# Patient Record
Sex: Female | Born: 1965 | Race: White | Hispanic: No | Smoking: Never smoker
Health system: Southern US, Community
[De-identification: ages and names within clinical notes are randomized; demographics above are authoritative.]

## PROBLEM LIST (undated history)

## (undated) DIAGNOSIS — K589 Irritable bowel syndrome without diarrhea: Secondary | ICD-10-CM

## (undated) DIAGNOSIS — Z8052 Family history of malignant neoplasm of bladder: Secondary | ICD-10-CM

## (undated) DIAGNOSIS — J45909 Unspecified asthma, uncomplicated: Secondary | ICD-10-CM

## (undated) DIAGNOSIS — Z800 Family history of malignant neoplasm of digestive organs: Secondary | ICD-10-CM

## (undated) DIAGNOSIS — Z803 Family history of malignant neoplasm of breast: Secondary | ICD-10-CM

## (undated) DIAGNOSIS — I10 Essential (primary) hypertension: Secondary | ICD-10-CM

## (undated) HISTORY — DX: Family history of malignant neoplasm of bladder: Z80.52

## (undated) HISTORY — DX: Family history of malignant neoplasm of digestive organs: Z80.0

## (undated) HISTORY — PX: ABDOMINAL HYSTERECTOMY: SHX81

## (undated) HISTORY — DX: Family history of malignant neoplasm of breast: Z80.3

---

## 1999-01-14 ENCOUNTER — Encounter: Payer: Self-pay | Admitting: Orthopedic Surgery

## 1999-01-14 ENCOUNTER — Encounter: Admission: RE | Admit: 1999-01-14 | Discharge: 1999-01-14 | Payer: Self-pay | Admitting: Orthopedic Surgery

## 1999-01-19 ENCOUNTER — Other Ambulatory Visit: Admission: RE | Admit: 1999-01-19 | Discharge: 1999-01-19 | Payer: Self-pay | Admitting: Family Medicine

## 2000-02-04 ENCOUNTER — Other Ambulatory Visit: Admission: RE | Admit: 2000-02-04 | Discharge: 2000-02-04 | Payer: Self-pay | Admitting: Family Medicine

## 2001-02-19 ENCOUNTER — Other Ambulatory Visit: Admission: RE | Admit: 2001-02-19 | Discharge: 2001-02-19 | Payer: Self-pay | Admitting: Family Medicine

## 2002-12-20 ENCOUNTER — Encounter: Admission: RE | Admit: 2002-12-20 | Discharge: 2002-12-20 | Payer: Self-pay | Admitting: Family Medicine

## 2003-03-31 ENCOUNTER — Other Ambulatory Visit: Admission: RE | Admit: 2003-03-31 | Discharge: 2003-03-31 | Payer: Self-pay | Admitting: Family Medicine

## 2004-04-13 ENCOUNTER — Other Ambulatory Visit: Admission: RE | Admit: 2004-04-13 | Discharge: 2004-04-13 | Payer: Self-pay | Admitting: Obstetrics & Gynecology

## 2006-09-19 ENCOUNTER — Ambulatory Visit (HOSPITAL_COMMUNITY): Admission: RE | Admit: 2006-09-19 | Discharge: 2006-09-21 | Payer: Self-pay | Admitting: Obstetrics & Gynecology

## 2006-09-19 ENCOUNTER — Encounter (INDEPENDENT_AMBULATORY_CARE_PROVIDER_SITE_OTHER): Payer: Self-pay | Admitting: Obstetrics & Gynecology

## 2010-05-25 NOTE — Op Note (Signed)
NAME:  Christina Watson, Christina Watson           ACCOUNT NO.:  [id number]   MEDICAL RECORD NO.:  [id number]          PATIENT TYPE:  OIB   LOCATION:  9320                          FACILITY:  [facility]   PHYSICIAN:  Freddy Finner, M.D.   DATE OF BIRTH:  Aug 10, 1965   DATE OF PROCEDURE:  09/19/2006  DATE OF DISCHARGE:                               OPERATIVE REPORT   PREOPERATIVE DIAGNOSIS:  Uterine leiomyomata.   POSTOPERATIVE DIAGNOSES:  Uterine leiomyomata with inter-ligamentous  right adnexal fibroid.   OPERATIVE PROCEDURE:  Laparoscopically-assisted vaginal hysterectomy,  right salpingectomy which was required to remove the inter-ligamentous  fibroid.   SURGEON:  Freddy Finner, M.D.   ASSISTANT:  __________ .   ESTIMATED INTRAOPERATIVE BLOOD LOSS:  Was 250 mL.   INTRAOPERATIVE COMPLICATIONS:  None.   INDICATIONS FOR PROCEDURE:  The details of the present illness are  recorded in the admission note.   DESCRIPTION OF PROCEDURE:  The patient was admitted on the morning of  surgery.  She was given one bolus of Ancef IV.  She was placed in PAS  compression hose.  She brought to the operating room and placed under  adequate general endotracheal anesthesia.  The abdomen, perineum and  vagina were prepped and draped in the usual fashion.  The bladder was  evacuated with a Robinson catheter.  Two small incisions were made, one  at the umbilicus and one just above the symphysis.  An 11 mm blade and  disposable trocar were introduced at the umbilicus while elevating the  anterior abdominal wall manually.  Careful and systematic examination of  the pelvic and abdominal contents was carried out.  The findings were  recorded in photographs which were returned to the office record.  The  upper abdomen appeared to be normal, including the liver and  gallbladder.  The appendix was visualized and was normal.  The pelvic  findings were normal.  The left tube and ovary were irregularly  enlarged.  The uterus  with fibroids, including a large inter-ligamentous  fibroid on the right side which distorted the mesosalpinx and distorted  the fallopian tube.  The right ovary itself was normal.  There was a  myoma along the right lateral uterus near the level of the uterine  artery.  Using a 5 mm trocar through the lower incision and a spring-  loaded grasping forceps and a blunt probe, appropriate traction and  exposure was achieved and the Giles tripolar device was used to  progressively develop pedicles on each side.  This dissection included  the right fallopian tube on the right side, leaving only the right  ovary.  Dissection was carried down to a level just above the uterine  artery, at the level of the fibroid, as described above.  On the left  side a similar dissection was carried out.  The tube and ovary were both  left on the left side.   Attention was turned vaginally.  A posterior weighted vaginal retractor  was placed.  The cervix was grasped with a Gerilyn Pilgrim tenaculum.  The mucosa  posterior to the cervix was grasped with an Lavena Bullion  and incision made  posterior to the cervix into the cul-de-sac.  The cervix was  circumscribed with a scalpel.  Using the LigaSure device the uterosacral  pedicles were sealed and divided, as were bladder pillars on either  side.  The bladder was further advanced off the cervix and lower  segment.  The Cardinal ligament and pedicles were intact, sealed and  divided with the LigaSure device.  The anterior peritoneum was entered.  The vessel pedicles were intact, sealed and divided.  This completed the  dissection on the right.  On the left an additional pedicle was  required, after delivery of the uterus through the vaginal introitus.  The uterus was weighed and weighed 260 grams.  The angles of the vagina  were anchored to the uterosacrals with a mattress sutures of #0  Monocryl.  The uterosacrals were plicated and the posterior peritoneum  closed with  interrupted #0 Monocryl.  The vaginal mucosa was closed  vertically with figure-of-eight's of #0 Monocryl.  The Foley catheter  was placed.  Clear urine was obtained.  A re-inspection laparoscopically  revealed minimal small oozing sources which were easily controlled with  the bipolar forceps, after a careful irrigation and confirmation of  hemostasis.  The irrigation solution was aspirated from the abdomen.  The instruments were removed.  The skin incisions were anesthetized with  __________  with 0.25% Marcaine.  The incisions were closed with  interrupted subcuticular sutures of #3-0 Dexon.  Steri-Strips were  applied to the lower incision.   The patient was awakened and taken to the recovery room in good  condition.      Freddy Finner, M.D.  Electronically Signed     WRN/MEDQ  D:  09/19/2006  T:  09/19/2006  Job:  [id number]

## 2010-05-25 NOTE — H&P (Signed)
NAME:  Christina Watson, Christina Watson           ACCOUNT NO.:  [id number]   MEDICAL RECORD NO.:  [id number]          PATIENT TYPE:  AMB   LOCATION:  SDC                           FACILITY:  [facility]   PHYSICIAN:  Freddy Finner, M.D.   DATE OF BIRTH:  [birth year]   DATE OF ADMISSION:  09/19/2006  DATE OF DISCHARGE:                              HISTORY & PHYSICAL   ADMISSION DIAGNOSES:  1. Uterine leiomyomata.  2. Clinical symptoms of menorrhagia.   The patient is a 45 year old, white, married female, gravida 2, para 2  who has been using oral contraceptives but has developed very very heavy  menses with heavy bleeding and clots. Examination in the office in June  of this year new finding of what were thought to be uterine leiomyomata  was found and pelvic ultrasound done on followup did confirm the  presence of 2 large uterine leiomyomata measuring 4.9 x 3.4 cm and 4.1 x  3.4 cm. The patient has requested definitive surgical intervention. She  is now admitted for laparoscopically assisted vaginal hysterectomy. It  is her wish not to have the tubes and ovaries removed if they are  normal. The potential risks of the procedure have been discussed  including injury to other organs, infection, vaginal bleeding, deep  venous thrombosis, postop infection. The prophylactic measures to reduce  the risk of these issues have also been discussed with her.   Her current review of systems is negative except for allergic symptoms  for which she takes several medications. She has no other  cardiopulmonary symptoms, she has no GI or GU symptoms.   PAST MEDICAL HISTORY:  She does have a history of peptic ulcer many  years ago. She has GERD. She has no other known significant medical  illnesses.   CURRENT MEDICATIONS:  Prevacid daily. She takes fluoxetine daily. She  uses Flonase as needed. She takes Advair and/or albuterol as needed. She  has no known drug allergies.   She is not a smoker. She has never had a  blood transfusion.   FAMILY HISTORY:  Remarkable for breast cancer in her mother and colon  cancer in her father. No other significant family history is noted.   PHYSICAL EXAMINATION:  HEENT:  Grossly within normal limits. No palpable  enlargement of the thyromegaly can be appreciated.  CHEST:  Clear to auscultation throughout.  HEART:  Normal sinus rhythm. There is a grade 2/6 early systolic murmur  at the right upper sternal border and left upper sternal border. No  other audible murmurs are heard, no rubs or gallops.  BREAST:  Considered to be normal. There are no palpable masses, no  nipple discharge, no skin change.  ABDOMEN:  Soft and nontender without appreciable organomegaly or  palpable masses.  PELVIC:  External genitalia, vagina and cervix are normal. Recent Pap  smear was normal in June of this year. Bimanual reveals uterus to be  enlarged and irregular. There are no palpable adnexal masses. The rectum  is palpably normal. Rectovaginal exam confirms the above findings.   ASSESSMENT:  Large uterine leiomyomata. Clinical symptoms of menorrhagia  are not controlled  with cyclic hormone therapy in the form of  contraceptive pills.   PLAN:  Laparoscopically assisted vaginal hysterectomy, perioperative  antibiotic and serial compression hose.      Freddy Finner, M.D.  Electronically Signed     WRN/MEDQ  D:  09/18/2006  T:  09/18/2006  Job:  [id number]

## 2010-05-25 NOTE — Discharge Summary (Signed)
NAME:  Christina Watson, Christina Watson           ACCOUNT NO.:  [id number]   MEDICAL RECORD NO.:  [id number]          PATIENT TYPE:  INP   LOCATION:  9320                          FACILITY:  [facility]   PHYSICIAN:  Freddy Finner, M.D.   DATE OF BIRTH:  January 23, 1965   DATE OF ADMISSION:  09/19/2006  DATE OF DISCHARGE:  09/21/2006                               DISCHARGE SUMMARY   DISCHARGE DIAGNOSES:  1. Uterine leiomyomata.  2. Menorrhagia.   OPERATIVE PROCEDURE:  Laparoscopic-assisted vaginal hysterectomy, right  salpingectomy.   INTRAOPERATIVE COMPLICATIONS:  None.   POSTOPERATIVE COMPLICATIONS:  Minor ileus and prolonged need for pain  management postoperatively, which delayed her discharge for one day.   CONDITION AT THE TIME OF DISCHARGE:  Good, with adequate symptomatic  relief.  The patient is having adequate bowel and bladder function.  She  is managing with oral pain medications.  She is discharged home with  progressively-increasing physical activity but no vaginal entry, no  heavy lifting.  She is to report heavy bleeding or fever.  She is to  return to the office in 2 weeks for postoperative followup.  She is to  resume all of her preoperative medications.  She is given Percocet 5/325  to be taken one or two every 4 hours as needed for postoperative pain.   Details of the present illness, past history, family history, review of  systems, and physical exam recorded in the admission note.  The physical  findings are remarkable for large uterine leiomyomata.  Her clinical  symptoms are remarkable for menorrhagia.   Laboratory data during this admission includes a normal prothrombin time  and PTT on admission, a normal CBC with hemoglobin of 13.3 on admission.  Postoperative hemoglobin was 10.8.   HOSPITAL COURSE:  The patient was admitted on the morning of surgery.  She was treated perioperatively with IV antibiotic and with PAS  antiembolic compression hose.  Her postoperative course was  without any  major complication.  She did have prolonged postoperative abdominal  distention and discomfort thought to be probably a mild ileus.  This  required an additional day of hospitalization for pain management and  adequate reestablishment of normal bowel function.  By the morning of  postoperative day #2 her condition was good.  She was discharged home  with disposition as noted above.      Freddy Finner, M.D.  Electronically Signed     WRN/MEDQ  D:  09/21/2006  T:  09/21/2006  Job:  [id number]

## 2010-10-22 LAB — CBC
HCT: 39.3
Hemoglobin: 10.8 — ABNORMAL LOW
Hemoglobin: 13.3
MCHC: 33.9
MCHC: 34.3
MCV: 83.7
MCV: 85.5
Platelets: 353
RBC: 3.69 — ABNORMAL LOW
RBC: 4.7
RDW: 14.7 — ABNORMAL HIGH
WBC: 8.3

## 2010-10-22 LAB — PROTIME-INR
INR: 0.9
Prothrombin Time: 12.6

## 2012-11-02 ENCOUNTER — Ambulatory Visit: Payer: 59

## 2012-11-02 ENCOUNTER — Ambulatory Visit (INDEPENDENT_AMBULATORY_CARE_PROVIDER_SITE_OTHER): Payer: 59

## 2012-11-02 ENCOUNTER — Ambulatory Visit: Payer: Self-pay

## 2012-11-02 VITALS — BP 137/82 | HR 74 | Resp 12 | Ht 64.0 in | Wt 208.0 lb

## 2012-11-02 DIAGNOSIS — M722 Plantar fascial fibromatosis: Secondary | ICD-10-CM

## 2012-11-02 DIAGNOSIS — M79671 Pain in right foot: Secondary | ICD-10-CM

## 2012-11-02 DIAGNOSIS — M79609 Pain in unspecified limb: Secondary | ICD-10-CM

## 2012-11-02 MED ORDER — MELOXICAM 15 MG PO TABS: 15.0000 mg | ORAL_TABLET | Freq: Every day | ORAL | Status: DC

## 2012-11-02 NOTE — Patient Instructions (Signed)
Plantar Fasciitis Plantar fasciitis is a common condition that causes foot pain. It is soreness (inflammation) of the band of tough fibrous tissue on the bottom of the foot that runs from the heel bone (calcaneus) to the ball of the foot. The cause of this soreness may be from excessive standing, poor fitting shoes, running on hard surfaces, being overweight, having an abnormal walk, or overuse (this is common in runners) of the painful foot or feet. It is also common in aerobic exercise dancers and ballet dancers. SYMPTOMS  Most people with plantar fasciitis complain of:  Severe pain in the morning on the bottom of their foot especially when taking the first steps out of bed. This pain recedes after a few minutes of walking.  Severe pain is experienced also during walking following a long period of inactivity.  Pain is worse when walking barefoot or up stairs DIAGNOSIS   Your caregiver will diagnose this condition by examining and feeling your foot.  Special tests such as X-rays of your foot, are usually not needed. PREVENTION   Consult a sports medicine professional before beginning a new exercise program.  Walking programs offer a good workout. With walking there is a lower chance of overuse injuries common to runners. There is less impact and less jarring of the joints.  Begin all new exercise programs slowly. If problems or pain develop, decrease the amount of time or distance until you are at a comfortable level.  Wear good shoes and replace them regularly.  Stretch your foot and the heel cords at the back of the ankle (Achilles tendon) both before and after exercise.  Run or exercise on even surfaces that are not hard. For example, asphalt is better than pavement.  Do not run barefoot on hard surfaces.  If using a treadmill, vary the incline.  Do not continue to workout if you have foot or joint problems. Seek professional help if they do not improve. HOME CARE INSTRUCTIONS     Avoid activities that cause you pain until you recover.  Use ice or cold packs on the problem or painful areas after working out.  Only take over-the-counter or prescription medicines for pain, discomfort, or fever as directed by your caregiver.  Soft shoe inserts or athletic shoes with air or gel sole cushions may be helpful.  If problems continue or become more severe, consult a sports medicine caregiver or your own health care provider. Cortisone is a potent anti-inflammatory medication that may be injected into the painful area. You can discuss this treatment with your caregiver. MAKE SURE YOU:   Understand these instructions.  Will watch your condition.  Will get help right away if you are not doing well or get worse. Document Released: 09/21/2000 Document Revised: 03/21/2011 Document Reviewed: 11/21/2007 [employer] Patient Information 2014 [employer] ICE INSTRUCTIONS  Apply ice or cold pack to the affected area at least 3 times a day for 10-15 minutes each time.  You should also use ice after prolonged activity or vigorous exercise.  Do not apply ice longer than 20 minutes at one time.  Always keep a cloth between your skin and the ice pack to prevent burns.  Being consistent and following these instructions will help control your symptoms.  We suggest you purchase a gel ice pack because they are reusable and do bit leak.  Some of them are designed to wrap around the area.  Use the method that works best for you.  Here are some other suggestions for   icing.   Use a frozen bag of peas or corn-inexpensive and molds well to your body, usually stays frozen for 10 to 20 minutes.  Wet a towel with cold water and squeeze out the excess until it's damp.  Place in a bag in the freezer for 20 minutes. Then remove and use.

## 2012-11-02 NOTE — Progress Notes (Signed)
  Subjective:    Patient ID: Christina Watson, female    DOB: 04-03-1965, 47 y.o.   MRN: [id number]  HPI Comments: B/L '' BOTH OF THE FOOT ARE HURTING, ESPECIALLY THE ARCHES''  Foot Pain This is a new problem. The current episode started more than 1 year ago. The problem occurs 2 to 4 times per day. The problem has been unchanged. Associated symptoms include numbness. The symptoms are aggravated by standing and walking. She has tried nothing for the symptoms. The treatment provided no relief.   back in April patient was diagnosed with hypertension. As a result she started an exercise class with a trainer and begin a treadmill and they'll sports which caused irritation to her feet arches. She also prescription for new balance shoes to a flexible [employer]ics type running shoe. Has had pain mid arch Magan plantar fascia bilateral feet first of the morning or getting up after pain. Rest.    Review of Systems  Constitutional: Negative.   HENT: Negative.   Eyes: Negative.   Respiratory: Negative.   Cardiovascular: Negative.   Gastrointestinal: Negative.   Endocrine: Negative.   Genitourinary: Positive for frequency.  Musculoskeletal: Negative.   Skin: Negative.   Allergic/Immunologic: Negative.   Neurological: Positive for numbness.  Hematological: Negative.   Psychiatric/Behavioral: Negative.        Objective:   Physical Exam  Vitals reviewed. Constitutional: She appears well-developed and well-nourished.  Cardiovascular:  Pulses:      Dorsalis pedis pulses are 2+ on the right side, and 2+ on the left side.       Posterior tibial pulses are 2+ on the right side, and 2+ on the left side.  Capillary refill 3 seconds all digits. Skin temperature warm turgor normal no edema rubor pallor or varicosities noted.  Musculoskeletal:  Orthopedic biomechanical exam reveals rectus foot type bilateral mild hallux extensors bilateral. Average arch height noted clinically and radiographically. On  x-rays there is a well-developed retrocalcaneal spurring no signs of fracture or osseous abnormality. Slight thickening of fascial structures noted.  Neurological: She is alert. She has normal strength and normal reflexes.  Epicritic and proprioceptive sensations intact and symmetric bilateral. Normal plantar response and DTRs  Skin: Skin is warm and dry.  Skin color pigment and hair growth, nails normal  Psychiatric: She has a normal mood and affect. Her behavior is normal.          Assessment & Plan:  Assessment this time is under fasciitis bilateral Magan plantar fascia. Plan at this time patient placed in a fascial strapping both feet recommend ice to the [employer] literature on fasciitis is dispensed prescription for Mobic is dispensed at this time with instructions. Patient also recommended he is crocs around the house and switch back to the new balance firm soled shoes. May be candidate for future orthoses followup in 2 weeks for reevaluation and assessment next  Alvan Dame DPM

## 2012-11-16 ENCOUNTER — Ambulatory Visit (INDEPENDENT_AMBULATORY_CARE_PROVIDER_SITE_OTHER): Payer: 59

## 2012-11-16 VITALS — BP 111/77 | HR 100 | Resp 12

## 2012-11-16 DIAGNOSIS — M79609 Pain in unspecified limb: Secondary | ICD-10-CM

## 2012-11-16 DIAGNOSIS — M722 Plantar fascial fibromatosis: Secondary | ICD-10-CM

## 2012-11-16 DIAGNOSIS — M79671 Pain in right foot: Secondary | ICD-10-CM

## 2012-11-16 NOTE — Progress Notes (Signed)
  Subjective:    Patient ID: Lyndel Pleasure, female    DOB: [birth year], 47 y.o.   MRN: [id number]  HPI Comments: '' BOTH FEET STILL HURTING''  Foot Pain   there was temporary improvement in pain symptoms while fascial strapping was in place for 5 days. Has been using crocs around the house which also provide some improvement also using new balance athletic shoes.  Review of Systems  Constitutional: Negative.   Respiratory: Negative.   Cardiovascular: Negative.   Gastrointestinal: Negative.   Endocrine: Negative.   Musculoskeletal: Negative.   Neurological: Negative.   Hematological: Negative.   All other systems reviewed and are negative.       Objective:   Physical Exam  Vitals reviewed. Constitutional: She is oriented to person, place, and time. She appears well-developed.  Cardiovascular: Intact distal pulses.   Capillary refill time 3 seconds all digits skin temperature warm turgor normal no edema rubor pallor or varicosities noted.  Neurological: She is alert and oriented to person, place, and time. She has normal reflexes.  Skin: Skin is warm and dry.  Psychiatric: She has a normal mood and affect. Her behavior is normal.   no other changes at this time and he still pain on palpation mid band plantar fascia medial calcaneal tubercle bilateral arch and mid foot. No other new changes or findings noted     Assessment & Plan:  Persistent plantar fasciitis/heel spur syndrome to respond to plantar fascial strapping, as such patient is a good candidate for functional orthotics at this time orthotics skin is carried out for new functional orthoses Spenco top cover and post rear foot to 2 varus skin is carried out patient be contacted with the next 3 or 4 weeks when orthotics are ready for fitting and dispensing. In the interim maintain NSAIDs as instructed also use ice to the heels as instructed recheck in 3-4 weeks for orthotic pickup and fitting  Alvan Dame DPM

## 2012-11-16 NOTE — Patient Instructions (Signed)

## 2012-12-14 ENCOUNTER — Ambulatory Visit (INDEPENDENT_AMBULATORY_CARE_PROVIDER_SITE_OTHER): Payer: 59

## 2012-12-14 VITALS — BP 103/68 | HR 93 | Resp 18

## 2012-12-14 DIAGNOSIS — M79609 Pain in unspecified limb: Secondary | ICD-10-CM

## 2012-12-14 DIAGNOSIS — M722 Plantar fascial fibromatosis: Secondary | ICD-10-CM

## 2012-12-14 DIAGNOSIS — M79671 Pain in right foot: Secondary | ICD-10-CM

## 2012-12-14 NOTE — Progress Notes (Signed)
   Subjective:    Patient ID: Christina Watson, female    DOB: [birth year], 47 y.o.   MRN: [id number]  HPI I am here to get my inserts and feet are hurting and sore and tender    Review of Systems deferred at this     Objective:   Physical Exam Neurovascular status is intact pedal pulses palpable. Patient continues to have plantar fascial symptomology bilateral. At this time orthotics are dispensed with break in wearing instructions a fifth contour well. Patient be recheck as needed for followup       Assessment & Plan:  Assessment plantar fasciitis/heel spur syndrome orthotics dispensed with functional use maintain NSAID as needed also ice to the heel as needed during break in period recheck in 2 months or an as-needed basis if symptoms fail to improve  Alvan Dame DPM

## 2012-12-14 NOTE — Patient Instructions (Signed)

## 2013-08-09 ENCOUNTER — Other Ambulatory Visit: Payer: Self-pay | Admitting: Physician Assistant

## 2013-11-11 ENCOUNTER — Other Ambulatory Visit: Payer: Self-pay | Admitting: Obstetrics & Gynecology

## 2013-11-12 LAB — CYTOLOGY - PAP

## 2014-06-26 ENCOUNTER — Other Ambulatory Visit: Payer: Self-pay | Admitting: Gastroenterology

## 2014-12-02 ENCOUNTER — Encounter: Payer: Self-pay | Admitting: Dietician

## 2014-12-02 ENCOUNTER — Encounter: Payer: 59 | Attending: Family Medicine | Admitting: Dietician

## 2014-12-02 VITALS — Ht 64.0 in | Wt 208.4 lb

## 2014-12-02 DIAGNOSIS — Z6835 Body mass index (BMI) 35.0-35.9, adult: Secondary | ICD-10-CM | POA: Insufficient documentation

## 2014-12-02 DIAGNOSIS — E669 Obesity, unspecified: Secondary | ICD-10-CM | POA: Diagnosis present

## 2014-12-02 DIAGNOSIS — Z713 Dietary counseling and surveillance: Secondary | ICD-10-CM | POA: Insufficient documentation

## 2014-12-02 NOTE — Patient Instructions (Addendum)
-  Choose carbs that are higher in fiber and lower in sugar (fresh or frozen fruits and vegetables, beans, whole grains) -Continue to work on pre portioning foods, especially snacks -Have protein food with each meal and snack -Work on stress management -Try 1 new food per week (experiment with new recipes)

## 2014-12-02 NOTE — Progress Notes (Signed)
  Medical Nutrition Therapy:  Appt start time: 300 end time:  415   Assessment:  Primary concerns today: Christina Watson is here today to discuss weight loss. Her husband just got diagnosed with type 2 diabetes and is very strict about his carbohydrate intake. Christina Watson has some issues with low blood sugars herself. She and her husband have been trying to eat "low carb" meals with meats and vegetables. 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 She has IBS that she has noticed it getting worse in recent years. She is unsure what foods cause IBS issues but notices anxiety makes it worse. She has started taking a probiotic and noticed that IBS symptoms aren't as bad. She lives with her husband and adult son. She and her husband share the grocery shopping duties and her husband does most of the cooking. She had a hysterectomy in 2009 and noticed significant weight gain (about 40 lbs in 6 months). She does not sleep well at night.   Preferred Learning Style:   No preference indicated   Learning Readiness:  Contemplating  Ready   MEDICATIONS: see list   DIETARY INTAKE:  Avoided foods include onions and peppers, (possible sensitivity to lactose).    24-hr recall:  B ( AM): coffee with sugar free creamer, honey nut cheerios and unsweetened vanilla almond milk, 2 slices [place] bacon  Snk (9:30-10 AM): 1/2 [employer] protein bar  L ( PM): can of Heart Healthy soup with water  Snk ( PM): [facility] protein bar D ( PM): chicken or ham with vegetables, [place] spaghetti, [place] chili Snk ( PM): sometimes 100-calorie snacks or popcorn or baked chips  Beverages: coffee with sugar free creamer, water, diet coke or Pepsi  Usual physical activity: walks dog daily (has Plantar Fasciitis)  Estimated energy needs: 1600-1800 calories  Progress Towards Goal(s):  In progress.   Nutritional Diagnosis:  [place]-3.4 Unintentional weight gain As related to hysterectomy, physical inactivity, and excessive energy  intake.  As evidenced by dietary recall and BMI.    Intervention:  Nutrition counseling provided. Goals: -Choose carbs that are higher in fiber and lower in sugar (fresh or frozen fruits and vegetables, beans, whole grains) -Continue to work on pre portioning foods, especially snacks -Have protein food with each meal and snack -Work on stress management -Try 1 new food per week (experiment with new recipes)  Teaching Method Utilized:  Visual Auditory Hands on  Handouts given during visit include:  Meal planning card  My Plate  Barriers to learning/adherence to lifestyle change: plantar fasciitis limiting mobility  Demonstrated degree of understanding via:  Teach Back   Monitoring/Evaluation:  Dietary intake, exercise, and body weight prn.

## 2016-01-14 DIAGNOSIS — Z01419 Encounter for gynecological examination (general) (routine) without abnormal findings: Secondary | ICD-10-CM | POA: Diagnosis not present

## 2016-04-25 DIAGNOSIS — M461 Sacroiliitis, not elsewhere classified: Secondary | ICD-10-CM | POA: Diagnosis not present

## 2016-04-25 DIAGNOSIS — M545 Low back pain: Secondary | ICD-10-CM | POA: Diagnosis not present

## 2016-04-25 DIAGNOSIS — M791 Myalgia: Secondary | ICD-10-CM | POA: Diagnosis not present

## 2016-04-25 DIAGNOSIS — M5127 Other intervertebral disc displacement, lumbosacral region: Secondary | ICD-10-CM | POA: Diagnosis not present

## 2016-04-25 DIAGNOSIS — M5137 Other intervertebral disc degeneration, lumbosacral region: Secondary | ICD-10-CM | POA: Diagnosis not present

## 2016-04-27 DIAGNOSIS — M5127 Other intervertebral disc displacement, lumbosacral region: Secondary | ICD-10-CM | POA: Diagnosis not present

## 2016-04-27 DIAGNOSIS — M461 Sacroiliitis, not elsewhere classified: Secondary | ICD-10-CM | POA: Diagnosis not present

## 2016-04-27 DIAGNOSIS — M791 Myalgia: Secondary | ICD-10-CM | POA: Diagnosis not present

## 2016-04-27 DIAGNOSIS — M5137 Other intervertebral disc degeneration, lumbosacral region: Secondary | ICD-10-CM | POA: Diagnosis not present

## 2016-04-27 DIAGNOSIS — M545 Low back pain: Secondary | ICD-10-CM | POA: Diagnosis not present

## 2016-05-02 DIAGNOSIS — M461 Sacroiliitis, not elsewhere classified: Secondary | ICD-10-CM | POA: Diagnosis not present

## 2016-05-02 DIAGNOSIS — M5137 Other intervertebral disc degeneration, lumbosacral region: Secondary | ICD-10-CM | POA: Diagnosis not present

## 2016-05-02 DIAGNOSIS — M5127 Other intervertebral disc displacement, lumbosacral region: Secondary | ICD-10-CM | POA: Diagnosis not present

## 2016-05-02 DIAGNOSIS — M791 Myalgia: Secondary | ICD-10-CM | POA: Diagnosis not present

## 2016-05-04 DIAGNOSIS — M5137 Other intervertebral disc degeneration, lumbosacral region: Secondary | ICD-10-CM | POA: Diagnosis not present

## 2016-05-04 DIAGNOSIS — M791 Myalgia: Secondary | ICD-10-CM | POA: Diagnosis not present

## 2016-05-04 DIAGNOSIS — M461 Sacroiliitis, not elsewhere classified: Secondary | ICD-10-CM | POA: Diagnosis not present

## 2016-05-04 DIAGNOSIS — M545 Low back pain: Secondary | ICD-10-CM | POA: Diagnosis not present

## 2016-05-04 DIAGNOSIS — M5127 Other intervertebral disc displacement, lumbosacral region: Secondary | ICD-10-CM | POA: Diagnosis not present

## 2016-05-09 DIAGNOSIS — M5127 Other intervertebral disc displacement, lumbosacral region: Secondary | ICD-10-CM | POA: Diagnosis not present

## 2016-05-09 DIAGNOSIS — M791 Myalgia: Secondary | ICD-10-CM | POA: Diagnosis not present

## 2016-05-09 DIAGNOSIS — M461 Sacroiliitis, not elsewhere classified: Secondary | ICD-10-CM | POA: Diagnosis not present

## 2016-05-11 DIAGNOSIS — M461 Sacroiliitis, not elsewhere classified: Secondary | ICD-10-CM | POA: Diagnosis not present

## 2016-05-11 DIAGNOSIS — M545 Low back pain: Secondary | ICD-10-CM | POA: Diagnosis not present

## 2016-05-11 DIAGNOSIS — M791 Myalgia: Secondary | ICD-10-CM | POA: Diagnosis not present

## 2016-05-16 DIAGNOSIS — R202 Paresthesia of skin: Secondary | ICD-10-CM | POA: Diagnosis not present

## 2016-05-16 DIAGNOSIS — M4726 Other spondylosis with radiculopathy, lumbar region: Secondary | ICD-10-CM | POA: Diagnosis not present

## 2016-05-18 DIAGNOSIS — M791 Myalgia: Secondary | ICD-10-CM | POA: Diagnosis not present

## 2016-05-18 DIAGNOSIS — M461 Sacroiliitis, not elsewhere classified: Secondary | ICD-10-CM | POA: Diagnosis not present

## 2016-05-18 DIAGNOSIS — M5127 Other intervertebral disc displacement, lumbosacral region: Secondary | ICD-10-CM | POA: Diagnosis not present

## 2016-05-23 DIAGNOSIS — M461 Sacroiliitis, not elsewhere classified: Secondary | ICD-10-CM | POA: Diagnosis not present

## 2016-05-23 DIAGNOSIS — M545 Low back pain: Secondary | ICD-10-CM | POA: Diagnosis not present

## 2016-05-23 DIAGNOSIS — M791 Myalgia: Secondary | ICD-10-CM | POA: Diagnosis not present

## 2016-05-24 DIAGNOSIS — E785 Hyperlipidemia, unspecified: Secondary | ICD-10-CM | POA: Diagnosis not present

## 2016-05-24 DIAGNOSIS — K589 Irritable bowel syndrome without diarrhea: Secondary | ICD-10-CM | POA: Diagnosis not present

## 2016-05-24 DIAGNOSIS — I10 Essential (primary) hypertension: Secondary | ICD-10-CM | POA: Diagnosis not present

## 2016-05-24 DIAGNOSIS — E559 Vitamin D deficiency, unspecified: Secondary | ICD-10-CM | POA: Diagnosis not present

## 2016-05-24 DIAGNOSIS — J45909 Unspecified asthma, uncomplicated: Secondary | ICD-10-CM | POA: Diagnosis not present

## 2016-05-25 DIAGNOSIS — M791 Myalgia: Secondary | ICD-10-CM | POA: Diagnosis not present

## 2016-05-25 DIAGNOSIS — M461 Sacroiliitis, not elsewhere classified: Secondary | ICD-10-CM | POA: Diagnosis not present

## 2016-05-25 DIAGNOSIS — M5127 Other intervertebral disc displacement, lumbosacral region: Secondary | ICD-10-CM | POA: Diagnosis not present

## 2016-05-30 DIAGNOSIS — M791 Myalgia: Secondary | ICD-10-CM | POA: Diagnosis not present

## 2016-05-30 DIAGNOSIS — M545 Low back pain: Secondary | ICD-10-CM | POA: Diagnosis not present

## 2016-05-30 DIAGNOSIS — M461 Sacroiliitis, not elsewhere classified: Secondary | ICD-10-CM | POA: Diagnosis not present

## 2016-06-01 DIAGNOSIS — M5127 Other intervertebral disc displacement, lumbosacral region: Secondary | ICD-10-CM | POA: Diagnosis not present

## 2016-06-01 DIAGNOSIS — M461 Sacroiliitis, not elsewhere classified: Secondary | ICD-10-CM | POA: Diagnosis not present

## 2016-06-01 DIAGNOSIS — M791 Myalgia: Secondary | ICD-10-CM | POA: Diagnosis not present

## 2016-06-07 DIAGNOSIS — M5127 Other intervertebral disc displacement, lumbosacral region: Secondary | ICD-10-CM | POA: Diagnosis not present

## 2016-06-07 DIAGNOSIS — M545 Low back pain: Secondary | ICD-10-CM | POA: Diagnosis not present

## 2016-06-07 DIAGNOSIS — M5137 Other intervertebral disc degeneration, lumbosacral region: Secondary | ICD-10-CM | POA: Diagnosis not present

## 2016-06-07 DIAGNOSIS — M461 Sacroiliitis, not elsewhere classified: Secondary | ICD-10-CM | POA: Diagnosis not present

## 2016-06-07 DIAGNOSIS — M791 Myalgia: Secondary | ICD-10-CM | POA: Diagnosis not present

## 2016-06-08 DIAGNOSIS — M5126 Other intervertebral disc displacement, lumbar region: Secondary | ICD-10-CM | POA: Diagnosis not present

## 2016-06-08 DIAGNOSIS — M461 Sacroiliitis, not elsewhere classified: Secondary | ICD-10-CM | POA: Diagnosis not present

## 2016-06-08 DIAGNOSIS — M791 Myalgia: Secondary | ICD-10-CM | POA: Diagnosis not present

## 2016-06-13 DIAGNOSIS — R202 Paresthesia of skin: Secondary | ICD-10-CM | POA: Diagnosis not present

## 2016-06-13 DIAGNOSIS — M4722 Other spondylosis with radiculopathy, cervical region: Secondary | ICD-10-CM | POA: Diagnosis not present

## 2016-06-15 DIAGNOSIS — M461 Sacroiliitis, not elsewhere classified: Secondary | ICD-10-CM | POA: Diagnosis not present

## 2016-06-15 DIAGNOSIS — M791 Myalgia: Secondary | ICD-10-CM | POA: Diagnosis not present

## 2016-06-15 DIAGNOSIS — M545 Low back pain: Secondary | ICD-10-CM | POA: Diagnosis not present

## 2016-06-20 DIAGNOSIS — M461 Sacroiliitis, not elsewhere classified: Secondary | ICD-10-CM | POA: Diagnosis not present

## 2016-06-20 DIAGNOSIS — M791 Myalgia: Secondary | ICD-10-CM | POA: Diagnosis not present

## 2016-06-20 DIAGNOSIS — M545 Low back pain: Secondary | ICD-10-CM | POA: Diagnosis not present

## 2016-06-27 DIAGNOSIS — M461 Sacroiliitis, not elsewhere classified: Secondary | ICD-10-CM | POA: Diagnosis not present

## 2016-06-27 DIAGNOSIS — M5136 Other intervertebral disc degeneration, lumbar region: Secondary | ICD-10-CM | POA: Diagnosis not present

## 2016-06-27 DIAGNOSIS — M5126 Other intervertebral disc displacement, lumbar region: Secondary | ICD-10-CM | POA: Diagnosis not present

## 2016-06-29 DIAGNOSIS — M461 Sacroiliitis, not elsewhere classified: Secondary | ICD-10-CM | POA: Diagnosis not present

## 2016-06-29 DIAGNOSIS — M5136 Other intervertebral disc degeneration, lumbar region: Secondary | ICD-10-CM | POA: Diagnosis not present

## 2016-06-29 DIAGNOSIS — M5126 Other intervertebral disc displacement, lumbar region: Secondary | ICD-10-CM | POA: Diagnosis not present

## 2016-07-04 DIAGNOSIS — M5136 Other intervertebral disc degeneration, lumbar region: Secondary | ICD-10-CM | POA: Diagnosis not present

## 2016-07-04 DIAGNOSIS — M461 Sacroiliitis, not elsewhere classified: Secondary | ICD-10-CM | POA: Diagnosis not present

## 2016-07-04 DIAGNOSIS — M5126 Other intervertebral disc displacement, lumbar region: Secondary | ICD-10-CM | POA: Diagnosis not present

## 2016-07-06 DIAGNOSIS — M5136 Other intervertebral disc degeneration, lumbar region: Secondary | ICD-10-CM | POA: Diagnosis not present

## 2016-07-06 DIAGNOSIS — M461 Sacroiliitis, not elsewhere classified: Secondary | ICD-10-CM | POA: Diagnosis not present

## 2016-07-06 DIAGNOSIS — M5126 Other intervertebral disc displacement, lumbar region: Secondary | ICD-10-CM | POA: Diagnosis not present

## 2016-07-11 DIAGNOSIS — M5126 Other intervertebral disc displacement, lumbar region: Secondary | ICD-10-CM | POA: Diagnosis not present

## 2016-07-11 DIAGNOSIS — M461 Sacroiliitis, not elsewhere classified: Secondary | ICD-10-CM | POA: Diagnosis not present

## 2016-07-11 DIAGNOSIS — M5136 Other intervertebral disc degeneration, lumbar region: Secondary | ICD-10-CM | POA: Diagnosis not present

## 2016-07-12 DIAGNOSIS — M5126 Other intervertebral disc displacement, lumbar region: Secondary | ICD-10-CM | POA: Diagnosis not present

## 2016-07-12 DIAGNOSIS — M461 Sacroiliitis, not elsewhere classified: Secondary | ICD-10-CM | POA: Diagnosis not present

## 2016-07-12 DIAGNOSIS — M5136 Other intervertebral disc degeneration, lumbar region: Secondary | ICD-10-CM | POA: Diagnosis not present

## 2016-07-18 DIAGNOSIS — M5136 Other intervertebral disc degeneration, lumbar region: Secondary | ICD-10-CM | POA: Diagnosis not present

## 2016-07-18 DIAGNOSIS — M5126 Other intervertebral disc displacement, lumbar region: Secondary | ICD-10-CM | POA: Diagnosis not present

## 2016-07-18 DIAGNOSIS — M461 Sacroiliitis, not elsewhere classified: Secondary | ICD-10-CM | POA: Diagnosis not present

## 2016-07-20 DIAGNOSIS — M5126 Other intervertebral disc displacement, lumbar region: Secondary | ICD-10-CM | POA: Diagnosis not present

## 2016-07-20 DIAGNOSIS — M5136 Other intervertebral disc degeneration, lumbar region: Secondary | ICD-10-CM | POA: Diagnosis not present

## 2016-07-20 DIAGNOSIS — M461 Sacroiliitis, not elsewhere classified: Secondary | ICD-10-CM | POA: Diagnosis not present

## 2016-07-22 DIAGNOSIS — L309 Dermatitis, unspecified: Secondary | ICD-10-CM | POA: Diagnosis not present

## 2016-09-05 DIAGNOSIS — D485 Neoplasm of uncertain behavior of skin: Secondary | ICD-10-CM | POA: Diagnosis not present

## 2016-09-05 DIAGNOSIS — L820 Inflamed seborrheic keratosis: Secondary | ICD-10-CM | POA: Diagnosis not present

## 2016-09-05 DIAGNOSIS — D225 Melanocytic nevi of trunk: Secondary | ICD-10-CM | POA: Diagnosis not present

## 2016-09-05 DIAGNOSIS — D2262 Melanocytic nevi of left upper limb, including shoulder: Secondary | ICD-10-CM | POA: Diagnosis not present

## 2016-09-05 DIAGNOSIS — Z85828 Personal history of other malignant neoplasm of skin: Secondary | ICD-10-CM | POA: Diagnosis not present

## 2016-09-05 DIAGNOSIS — D2261 Melanocytic nevi of right upper limb, including shoulder: Secondary | ICD-10-CM | POA: Diagnosis not present

## 2016-12-20 DIAGNOSIS — L723 Sebaceous cyst: Secondary | ICD-10-CM | POA: Diagnosis not present

## 2016-12-20 DIAGNOSIS — R7989 Other specified abnormal findings of blood chemistry: Secondary | ICD-10-CM | POA: Diagnosis not present

## 2016-12-20 DIAGNOSIS — R6889 Other general symptoms and signs: Secondary | ICD-10-CM | POA: Diagnosis not present

## 2016-12-20 DIAGNOSIS — G5601 Carpal tunnel syndrome, right upper limb: Secondary | ICD-10-CM | POA: Diagnosis not present

## 2016-12-20 DIAGNOSIS — Z0000 Encounter for general adult medical examination without abnormal findings: Secondary | ICD-10-CM | POA: Diagnosis not present

## 2016-12-27 DIAGNOSIS — M5412 Radiculopathy, cervical region: Secondary | ICD-10-CM | POA: Diagnosis not present

## 2016-12-27 DIAGNOSIS — E7800 Pure hypercholesterolemia, unspecified: Secondary | ICD-10-CM | POA: Diagnosis not present

## 2016-12-27 DIAGNOSIS — M79641 Pain in right hand: Secondary | ICD-10-CM | POA: Diagnosis not present

## 2016-12-27 DIAGNOSIS — G5603 Carpal tunnel syndrome, bilateral upper limbs: Secondary | ICD-10-CM | POA: Diagnosis not present

## 2016-12-27 DIAGNOSIS — M79642 Pain in left hand: Secondary | ICD-10-CM | POA: Diagnosis not present

## 2016-12-28 DIAGNOSIS — M542 Cervicalgia: Secondary | ICD-10-CM | POA: Diagnosis not present

## 2016-12-28 DIAGNOSIS — G5603 Carpal tunnel syndrome, bilateral upper limbs: Secondary | ICD-10-CM | POA: Diagnosis not present

## 2016-12-30 ENCOUNTER — Other Ambulatory Visit: Payer: Self-pay | Admitting: Orthopedic Surgery

## 2017-01-23 DIAGNOSIS — M5412 Radiculopathy, cervical region: Secondary | ICD-10-CM | POA: Diagnosis not present

## 2017-01-23 DIAGNOSIS — G5603 Carpal tunnel syndrome, bilateral upper limbs: Secondary | ICD-10-CM | POA: Diagnosis not present

## 2017-01-26 ENCOUNTER — Encounter (HOSPITAL_BASED_OUTPATIENT_CLINIC_OR_DEPARTMENT_OTHER): Payer: Self-pay | Admitting: *Deleted

## 2017-01-30 ENCOUNTER — Encounter (HOSPITAL_BASED_OUTPATIENT_CLINIC_OR_DEPARTMENT_OTHER)
Admission: RE | Admit: 2017-01-30 | Discharge: 2017-01-30 | Disposition: A | Discharge status: Home / Self Care | Payer: 59 | Source: Ambulatory Visit | Attending: Orthopedic Surgery | Admitting: Orthopedic Surgery

## 2017-01-30 DIAGNOSIS — E669 Obesity, unspecified: Secondary | ICD-10-CM | POA: Diagnosis not present

## 2017-01-30 DIAGNOSIS — I10 Essential (primary) hypertension: Secondary | ICD-10-CM | POA: Diagnosis not present

## 2017-01-30 DIAGNOSIS — Z79899 Other long term (current) drug therapy: Secondary | ICD-10-CM | POA: Diagnosis not present

## 2017-01-30 DIAGNOSIS — G5602 Carpal tunnel syndrome, left upper limb: Secondary | ICD-10-CM | POA: Diagnosis not present

## 2017-01-30 DIAGNOSIS — Z6836 Body mass index (BMI) 36.0-36.9, adult: Secondary | ICD-10-CM | POA: Diagnosis not present

## 2017-01-30 DIAGNOSIS — J45909 Unspecified asthma, uncomplicated: Secondary | ICD-10-CM | POA: Diagnosis not present

## 2017-01-30 DIAGNOSIS — K589 Irritable bowel syndrome without diarrhea: Secondary | ICD-10-CM | POA: Diagnosis not present

## 2017-01-30 DIAGNOSIS — K219 Gastro-esophageal reflux disease without esophagitis: Secondary | ICD-10-CM | POA: Diagnosis not present

## 2017-02-02 ENCOUNTER — Encounter (HOSPITAL_BASED_OUTPATIENT_CLINIC_OR_DEPARTMENT_OTHER): Payer: Self-pay

## 2017-02-02 ENCOUNTER — Encounter (HOSPITAL_BASED_OUTPATIENT_CLINIC_OR_DEPARTMENT_OTHER)
Admission: RE | Disposition: A | Discharge status: Home / Self Care | Payer: Self-pay | Source: Ambulatory Visit | Attending: Orthopedic Surgery

## 2017-02-02 ENCOUNTER — Ambulatory Visit (HOSPITAL_BASED_OUTPATIENT_CLINIC_OR_DEPARTMENT_OTHER): Payer: 59 | Admitting: Anesthesiology

## 2017-02-02 ENCOUNTER — Ambulatory Visit (HOSPITAL_BASED_OUTPATIENT_CLINIC_OR_DEPARTMENT_OTHER)
Admission: RE | Admit: 2017-02-02 | Discharge: 2017-02-02 | Disposition: A | Discharge status: Home / Self Care | Payer: 59 | Source: Ambulatory Visit | Attending: Orthopedic Surgery | Admitting: Orthopedic Surgery

## 2017-02-02 ENCOUNTER — Other Ambulatory Visit: Payer: Self-pay

## 2017-02-02 DIAGNOSIS — Z6836 Body mass index (BMI) 36.0-36.9, adult: Secondary | ICD-10-CM | POA: Insufficient documentation

## 2017-02-02 DIAGNOSIS — I10 Essential (primary) hypertension: Secondary | ICD-10-CM | POA: Insufficient documentation

## 2017-02-02 DIAGNOSIS — E669 Obesity, unspecified: Secondary | ICD-10-CM | POA: Insufficient documentation

## 2017-02-02 DIAGNOSIS — G5602 Carpal tunnel syndrome, left upper limb: Secondary | ICD-10-CM | POA: Diagnosis not present

## 2017-02-02 DIAGNOSIS — Z79899 Other long term (current) drug therapy: Secondary | ICD-10-CM | POA: Insufficient documentation

## 2017-02-02 DIAGNOSIS — K219 Gastro-esophageal reflux disease without esophagitis: Secondary | ICD-10-CM | POA: Insufficient documentation

## 2017-02-02 DIAGNOSIS — J45909 Unspecified asthma, uncomplicated: Secondary | ICD-10-CM | POA: Insufficient documentation

## 2017-02-02 DIAGNOSIS — K589 Irritable bowel syndrome without diarrhea: Secondary | ICD-10-CM | POA: Insufficient documentation

## 2017-02-02 HISTORY — PX: CARPAL TUNNEL RELEASE: SHX101

## 2017-02-02 HISTORY — DX: Essential (primary) hypertension: I10

## 2017-02-02 HISTORY — DX: Irritable bowel syndrome, unspecified: K58.9

## 2017-02-02 HISTORY — DX: Unspecified asthma, uncomplicated: J45.909

## 2017-02-02 SURGERY — CARPAL TUNNEL RELEASE
Anesthesia: Regional | Site: Wrist | Laterality: Left

## 2017-02-02 MED ORDER — CEFAZOLIN SODIUM-DEXTROSE 2-4 GM/100ML-% IV SOLN
2.0000 g | INTRAVENOUS | Status: AC
  Administered 2017-02-02: 2 g via INTRAVENOUS

## 2017-02-02 MED ORDER — MIDAZOLAM HCL 2 MG/2ML IJ SOLN
INTRAMUSCULAR | Status: AC
  Filled 2017-02-02: qty 2

## 2017-02-02 MED ORDER — PROPOFOL 10 MG/ML IV BOLUS
INTRAVENOUS | Status: DC | PRN
  Administered 2017-02-02 (×2): 10 mg via INTRAVENOUS

## 2017-02-02 MED ORDER — LIDOCAINE HCL (PF) 0.5 % IJ SOLN
INTRAMUSCULAR | Status: DC | PRN
  Administered 2017-02-02: 30 mL via INTRAVENOUS

## 2017-02-02 MED ORDER — FENTANYL CITRATE (PF) 100 MCG/2ML IJ SOLN: 50.0000 ug | INTRAMUSCULAR | Status: DC | PRN

## 2017-02-02 MED ORDER — CHLORHEXIDINE GLUCONATE 4 % EX LIQD: 60.0000 mL | Freq: Once | CUTANEOUS | Status: DC

## 2017-02-02 MED ORDER — LACTATED RINGERS IV SOLN
INTRAVENOUS | Status: DC
  Administered 2017-02-02: 10:00:00 via INTRAVENOUS

## 2017-02-02 MED ORDER — MEPERIDINE HCL 25 MG/ML IJ SOLN: 6.2500 mg | INTRAMUSCULAR | Status: DC | PRN

## 2017-02-02 MED ORDER — SCOPOLAMINE 1 MG/3DAYS TD PT72: 1.0000 | MEDICATED_PATCH | Freq: Once | TRANSDERMAL | Status: DC | PRN

## 2017-02-02 MED ORDER — BUPIVACAINE HCL (PF) 0.25 % IJ SOLN
INTRAMUSCULAR | Status: DC | PRN
  Administered 2017-02-02: 10 mL

## 2017-02-02 MED ORDER — CEFAZOLIN SODIUM-DEXTROSE 2-4 GM/100ML-% IV SOLN
INTRAVENOUS | Status: AC
  Filled 2017-02-02: qty 100

## 2017-02-02 MED ORDER — FENTANYL CITRATE (PF) 100 MCG/2ML IJ SOLN: 25.0000 ug | INTRAMUSCULAR | Status: DC | PRN

## 2017-02-02 MED ORDER — MIDAZOLAM HCL 2 MG/2ML IJ SOLN: 1.0000 mg | INTRAMUSCULAR | Status: DC | PRN

## 2017-02-02 MED ORDER — FENTANYL CITRATE (PF) 100 MCG/2ML IJ SOLN
INTRAMUSCULAR | Status: DC | PRN
  Administered 2017-02-02: 100 ug via INTRAVENOUS

## 2017-02-02 MED ORDER — MIDAZOLAM HCL 5 MG/5ML IJ SOLN
INTRAMUSCULAR | Status: DC | PRN
  Administered 2017-02-02: 2 mg via INTRAVENOUS

## 2017-02-02 MED ORDER — HYDROCODONE-ACETAMINOPHEN 5-325 MG PO TABS: ORAL_TABLET | ORAL | 0 refills | Status: AC

## 2017-02-02 MED ORDER — HYDROCODONE-ACETAMINOPHEN 7.5-325 MG PO TABS: 1.0000 | ORAL_TABLET | Freq: Once | ORAL | Status: DC | PRN

## 2017-02-02 MED ORDER — METOCLOPRAMIDE HCL 5 MG/ML IJ SOLN: 10.0000 mg | Freq: Once | INTRAMUSCULAR | Status: DC | PRN

## 2017-02-02 MED ORDER — FENTANYL CITRATE (PF) 100 MCG/2ML IJ SOLN
INTRAMUSCULAR | Status: AC
  Filled 2017-02-02: qty 2

## 2017-02-02 SURGICAL SUPPLY — 41 items
BANDAGE ACE 3X5.8 VEL STRL LF (GAUZE/BANDAGES/DRESSINGS) ×2 IMPLANT
BLADE SURG 15 STRL LF DISP TIS (BLADE) ×2 IMPLANT
BLADE SURG 15 STRL SS (BLADE) ×2
BNDG ESMARK 4X9 LF (GAUZE/BANDAGES/DRESSINGS) IMPLANT
BNDG GAUZE ELAST 4 BULKY (GAUZE/BANDAGES/DRESSINGS) ×2 IMPLANT
CHLORAPREP W/TINT 26ML (MISCELLANEOUS) ×2 IMPLANT
CORD BIPOLAR FORCEPS 12FT (ELECTRODE) ×2 IMPLANT
COVER BACK TABLE 60X90IN (DRAPES) ×2 IMPLANT
COVER MAYO STAND STRL (DRAPES) ×2 IMPLANT
CUFF TOURNIQUET SINGLE 18IN (TOURNIQUET CUFF) ×2 IMPLANT
DRAPE EXTREMITY T 121X128X90 (DRAPE) ×2 IMPLANT
DRAPE SURG 17X23 STRL (DRAPES) ×2 IMPLANT
DRSG PAD ABDOMINAL 8X10 ST (GAUZE/BANDAGES/DRESSINGS) ×2 IMPLANT
GAUZE SPONGE 4X4 12PLY STRL (GAUZE/BANDAGES/DRESSINGS) ×2 IMPLANT
GAUZE XEROFORM 1X8 LF (GAUZE/BANDAGES/DRESSINGS) ×2 IMPLANT
GLOVE BIO SURGEON STRL SZ7.5 (GLOVE) ×2 IMPLANT
GLOVE BIOGEL PI IND STRL 7.0 (GLOVE) ×2 IMPLANT
GLOVE BIOGEL PI IND STRL 7.5 (GLOVE) ×1 IMPLANT
GLOVE BIOGEL PI IND STRL 8 (GLOVE) ×1 IMPLANT
GLOVE BIOGEL PI INDICATOR 7.0 (GLOVE) ×2
GLOVE BIOGEL PI INDICATOR 7.5 (GLOVE) ×1
GLOVE BIOGEL PI INDICATOR 8 (GLOVE) ×1
GLOVE ECLIPSE 6.5 STRL STRAW (GLOVE) ×4 IMPLANT
GLOVE SURG SS PI 7.5 STRL IVOR (GLOVE) ×2 IMPLANT
GLOVE SURG SS PI 8.5 STRL IVOR (GLOVE) ×1
GLOVE SURG SS PI 8.5 STRL STRW (GLOVE) ×1 IMPLANT
GOWN STRL REUS W/ TWL LRG LVL3 (GOWN DISPOSABLE) ×1 IMPLANT
GOWN STRL REUS W/ TWL XL LVL3 (GOWN DISPOSABLE) ×2 IMPLANT
GOWN STRL REUS W/TWL LRG LVL3 (GOWN DISPOSABLE) ×1
GOWN STRL REUS W/TWL XL LVL3 (GOWN DISPOSABLE) ×4 IMPLANT
NEEDLE HYPO 25X1 1.5 SAFETY (NEEDLE) ×2 IMPLANT
NS IRRIG 1000ML POUR BTL (IV SOLUTION) ×2 IMPLANT
PACK BASIN DAY SURGERY FS (CUSTOM PROCEDURE TRAY) ×2 IMPLANT
PADDING CAST ABS 4INX4YD NS (CAST SUPPLIES) ×1
PADDING CAST ABS COTTON 4X4 ST (CAST SUPPLIES) ×1 IMPLANT
STOCKINETTE 4X48 STRL (DRAPES) ×2 IMPLANT
SUT ETHILON 4 0 PS 2 18 (SUTURE) ×2 IMPLANT
SYR BULB 3OZ (MISCELLANEOUS) ×2 IMPLANT
SYR CONTROL 10ML LL (SYRINGE) ×2 IMPLANT
TOWEL OR 17X24 6PK STRL BLUE (TOWEL DISPOSABLE) ×4 IMPLANT
UNDERPAD 30X30 (UNDERPADS AND DIAPERS) ×2 IMPLANT

## 2017-02-02 NOTE — H&P (Signed)
  Christina Watson is an 52 y.o. female.   Chief Complaint: left carpal tunnel syndrome HPI: 52 yo female with numbness and tingling in bilateral hands.  Positive nerve conduction studies.  She wishes to have a carpal tunnel release for management of her symptoms.  Allergies: No Known Allergies  Past Medical History:  Diagnosis Date  . Asthma   . Hypertension   . Irritable bowel disease     Past Surgical History:  Procedure Laterality Date  . ABDOMINAL HYSTERECTOMY      Family History: Family History  Problem Relation Age of Onset  . Diabetes Mother   . Cancer Mother        bladder  . Cancer Father        lung  . Diabetes Sister     Social History:   reports that  has never smoked. she has never used smokeless tobacco. She reports that she does not drink alcohol or use drugs.  Medications: Medications Prior to Admission  Medication Sig Dispense Refill  . calcium carbonate (TUMS - DOSED IN MG ELEMENTAL CALCIUM) 500 MG chewable tablet Chew 1 tablet by mouth daily.    . cholecalciferol (VITAMIN D) 1000 UNITS tablet Take 1,000 Units by mouth daily.    . Fish Oil-Cholecalciferol (FISH OIL + D3 PO) Take by mouth.    . fluticasone (FLONASE) 50 MCG/ACT nasal spray Place 2 sprays into the nose daily.    . hyoscyamine (ANASPAZ) 0.125 MG TBDP tablet Place under the tongue.    . lansoprazole (PREVACID) 15 MG capsule Take 15 mg by mouth daily.    . Multiple Vitamin (MULTIVITAMIN) tablet Take 1 tablet by mouth daily.    Marland Kitchen olmesartan (BENICAR) 40 MG tablet Take 40 mg by mouth daily.    Marland Kitchen albuterol (PROVENTIL HFA;VENTOLIN HFA) 108 (90 BASE) MCG/ACT inhaler Inhale 2 puffs into the lungs every 6 (six) hours as needed for wheezing.      No results found for this or any previous visit (from the past 48 hour(s)).  No results found.   A comprehensive review of systems was negative.  Height 5\' 4"  (1.626 m), weight 96.2 kg (212 lb).  General appearance: alert, cooperative and appears  stated age Head: Normocephalic, without obvious abnormality, atraumatic Neck: supple, symmetrical, trachea midline Resp: clear to auscultation bilaterally Cardio: regular rate and rhythm GI: non-tender Extremities: Intact sensation and capillary refill all digits.  +epl/fpl/io.  No wounds.  Pulses: 2+ and symmetric Skin: Skin color, texture, turgor normal. No rashes or lesions Neurologic: Grossly normal Incision/Wound:none  Assessment/Plan Left carpal tunnel syndrome.  Non operative and operative treatment options were discussed with the patient and patient wishes to proceed with operative treatment. Risks, benefits, and alternatives of surgery were discussed and the patient agrees with the plan of care.   Samara Stankowski R 02/02/2017, 10:06 AM

## 2017-02-02 NOTE — Transfer of Care (Signed)
Immediate Anesthesia Transfer of Care Note  Patient: Johara Lodwick Zundel  Procedure(s) Performed: LEFT CARPAL TUNNEL RELEASE (Left Wrist)  Patient Location: PACU  Anesthesia Type:Bier block  Level of Consciousness: awake and patient cooperative  Airway & Oxygen Therapy: Patient Spontanous Breathing and Patient connected to face mask oxygen  Post-op Assessment: Report given to RN and Post -op Vital signs reviewed and stable  Post vital signs: Reviewed and stable  Last Vitals:  Vitals:   02/02/17 1015  BP: (!) 132/57  Pulse: 78  Resp: 18  Temp: 36.7 C  SpO2: 97%    Last Pain:  Vitals:   02/02/17 1015  TempSrc: Oral  PainSc: 0-No pain         Complications: No apparent anesthesia complications

## 2017-02-02 NOTE — Brief Op Note (Signed)
02/02/2017  11:56 AM  PATIENT:  Rico Junker Copelan  52 y.o. female  PRE-OPERATIVE DIAGNOSIS:  left carpal tunnel syndrome G56.02  POST-OPERATIVE DIAGNOSIS:  left carpal tunnel syndrome G56.02  PROCEDURE:  Procedure(s): LEFT CARPAL TUNNEL RELEASE (Left)  SURGEON:  Surgeon(s) and Role:    Leanora Cover, MD - Primary  PHYSICIAN ASSISTANT:   ASSISTANTS: none   ANESTHESIA:   Bier block with sedation  EBL:  Minimal  BLOOD ADMINISTERED:none  DRAINS: none   LOCAL MEDICATIONS USED:  MARCAINE     SPECIMEN:  No Specimen  DISPOSITION OF SPECIMEN:  N/A  COUNTS:  YES  TOURNIQUET:   Total Tourniquet Time Documented: Forearm (Left) - 24 minutes Total: Forearm (Left) - 24 minutes   DICTATION: .Note written in EPIC  PLAN OF CARE: Discharge to home after PACU  PATIENT DISPOSITION:  PACU - hemodynamically stable.

## 2017-02-02 NOTE — Anesthesia Postprocedure Evaluation (Signed)
Anesthesia Post Note  Patient: Christina Watson  Procedure(s) Performed: LEFT CARPAL TUNNEL RELEASE (Left Wrist)     Patient location during evaluation: PACU Anesthesia Type: Bier Block Level of consciousness: awake and alert and oriented Pain management: pain level controlled Vital Signs Assessment: post-procedure vital signs reviewed and stable Respiratory status: spontaneous breathing, nonlabored ventilation and respiratory function stable Cardiovascular status: blood pressure returned to baseline and stable Postop Assessment: no apparent nausea or vomiting Anesthetic complications: no    Last Vitals:  Vitals:   02/02/17 1015 02/02/17 1200  BP: (!) 132/57 116/70  Pulse: 78 77  Resp: 18 14  Temp: 36.7 C (P) 36.7 C  SpO2: 97% 100%    Last Pain:  Vitals:   02/02/17 1015  TempSrc: Oral  PainSc: 0-No pain                 Marielys Trinidad A.

## 2017-02-02 NOTE — Anesthesia Preprocedure Evaluation (Addendum)
Anesthesia Evaluation    Airway Mallampati: II  TM Distance: >3 FB Neck ROM: Full    Dental no notable dental hx. (+) Teeth Intact   Pulmonary asthma ,    Pulmonary exam normal breath sounds clear to auscultation       Cardiovascular hypertension, Pt. on medications Normal cardiovascular exam Rhythm:Regular Rate:Normal     Neuro/Psych Left CTS  Neuromuscular disease negative psych ROS   GI/Hepatic Neg liver ROS, GERD  Medicated and Controlled,  Endo/Other  Obesity  Renal/GU negative Renal ROS  negative genitourinary   Musculoskeletal negative musculoskeletal ROS (+)   Abdominal (+) + obese,   Peds  Hematology negative hematology ROS (+)   Anesthesia Other Findings   Reproductive/Obstetrics                             Anesthesia Physical Anesthesia Plan  ASA: II  Anesthesia Plan: Bier Block and Bier Block-LIDOCAINE ONLY   Post-op Pain Management:    Induction: Intravenous  PONV Risk Score and Plan: 2 and Treatment may vary due to age or medical condition, Ondansetron and Propofol infusion  Airway Management Planned: Natural Airway and Simple Face Mask  Additional Equipment:   Intra-op Plan:   Post-operative Plan:   Informed Consent: I have reviewed the patients History and Physical, chart, labs and discussed the procedure including the risks, benefits and alternatives for the proposed anesthesia with the patient or authorized representative who has indicated his/her understanding and acceptance.   Dental advisory given  Plan Discussed with: CRNA, Anesthesiologist and Surgeon  Anesthesia Plan Comments:         Anesthesia Quick Evaluation

## 2017-02-02 NOTE — Anesthesia Procedure Notes (Signed)
Anesthesia Regional Block: Bier block (IV Regional)   Pre-Anesthetic Checklist: ,, timeout performed, Correct Patient, Correct Site, Correct Laterality, Correct Procedure, Correct Position, site marked, Risks and benefits discussed,  Surgical consent,  Pre-op evaluation,  At surgeon's request and post-op pain management  Laterality: Left  Prep: chloraprep        Procedures:,,,,,, Esmarch exsanguination,,  Narrative:  Start time: 02/02/2017 11:30 AM End time: 02/02/2017 11:33 AM  Events: blood aspirated,,,,,,,,,,

## 2017-02-02 NOTE — Discharge Instructions (Addendum)

## 2017-02-02 NOTE — Op Note (Signed)
02/02/2017 [facility]                              OPERATIVE REPORT   PREOPERATIVE DIAGNOSIS:  Left carpal tunnel syndrome.  POSTOPERATIVE DIAGNOSIS:  Left carpal tunnel syndrome.  PROCEDURE:  Left carpal tunnel release.  SURGEON:  Leanora Cover, MD  ASSISTANT:  none.  ANESTHESIA:  Bier block and sedation.  IV FLUIDS:  Per anesthesia flow sheet.  ESTIMATED BLOOD LOSS:  Minimal.  COMPLICATIONS:  None.  SPECIMENS:  None.  TOURNIQUET TIME:    Total Tourniquet Time Documented: Forearm (Left) - 24 minutes Total: Forearm (Left) - 24 minutes   DISPOSITION:  Stable to PACU.  LOCATION: [facility]  INDICATIONS:  52 yo female with numbness and tingling in finger.  Positive nerve conduction studies.  She wishes to have a carpal tunnel release for management of her symptoms.  Risks, benefits and alternatives of surgery were discussed including the risk of blood loss; infection; damage to nerves, vessels, tendons, ligaments, bone; failure of surgery; need for additional surgery; complications with wound healing; continued pain; recurrence of carpal tunnel syndrome; and damage to motor branch. She voiced understanding of these risks and elected to proceed.   OPERATIVE COURSE:  After being identified preoperatively by myself, the patient and I agreed upon the procedure and site of procedure.  The surgical site was marked.  The risks, benefits, and alternatives of the surgery were reviewed and she wished to proceed.  Surgical consent had been signed.  She was given IV Ancef as preoperative antibiotic prophylaxis.  She was transferred to the operating room and placed on the operating room table in supine position with the Left upper extremity on an armboard.  Bier block and sedation was induced by Anesthesiology.  Left upper extremity was prepped and draped in normal sterile orthopaedic fashion.  A surgical pause was performed between the surgeons, anesthesia, and  operating room staff, and all were in agreement as to the patient, procedure, and site of procedure.  Tourniquet at the proximal aspect of the forearm had been inflated for the Bier block.  Anesthesia was augmented with local injection of marcaine.  Incision was made over the transverse carpal ligament and carried into the subcutaneous tissues by spreading technique.  Bipolar electrocautery was used to obtain hemostasis.  The palmar fascia was sharply incised.  The transverse carpal ligament was identified and sharply incised.  It was incised distally first.  Care was taken to ensure complete decompression distally.  It was then incised proximally.  Scissors were used to split the distal aspect of the volar antebrachial fascia.  A finger was placed into the wound to ensure complete decompression, which was the case.  The nerve was examined.  It was adherent to the radial leaflet.  The motor branch was identified and was intact.  The wound was copiously irrigated with sterile saline.  It was then closed with 4-0 nylon in a horizontal mattress fashion.  It was injected with 0.25% plain Marcaine to aid in postoperative analgesia.  It was dressed with sterile Xeroform, 4x4s, an ABD, and wrapped with Kerlix and an Ace bandage.  Tourniquet was deflated at 24 minutes.  Fingertips were pink with brisk capillary refill after deflation of the tourniquet.  Operative drapes were broken down.  The patient was awoken from anesthesia safely.  She was transferred back to stretcher and taken to the PACU in stable  condition.  I will see her back in the office in 1 week for postoperative followup.  I will give her a prescription for norco 5/325 1-2 tabs PO q6 hours prn pain, dispense #20.    Tennis Must, MD Electronically signed, 02/02/17

## 2017-02-03 ENCOUNTER — Encounter (HOSPITAL_BASED_OUTPATIENT_CLINIC_OR_DEPARTMENT_OTHER): Payer: Self-pay | Admitting: Orthopedic Surgery

## 2017-02-27 DIAGNOSIS — Z6836 Body mass index (BMI) 36.0-36.9, adult: Secondary | ICD-10-CM | POA: Diagnosis not present

## 2017-02-27 DIAGNOSIS — Z01419 Encounter for gynecological examination (general) (routine) without abnormal findings: Secondary | ICD-10-CM | POA: Diagnosis not present

## 2017-05-08 DIAGNOSIS — M25551 Pain in right hip: Secondary | ICD-10-CM | POA: Diagnosis not present

## 2017-05-08 DIAGNOSIS — M79604 Pain in right leg: Secondary | ICD-10-CM | POA: Diagnosis not present

## 2017-05-08 DIAGNOSIS — M5431 Sciatica, right side: Secondary | ICD-10-CM | POA: Diagnosis not present

## 2017-05-09 DIAGNOSIS — M79604 Pain in right leg: Secondary | ICD-10-CM | POA: Diagnosis not present

## 2017-05-09 DIAGNOSIS — M5431 Sciatica, right side: Secondary | ICD-10-CM | POA: Diagnosis not present

## 2017-05-09 DIAGNOSIS — M25551 Pain in right hip: Secondary | ICD-10-CM | POA: Diagnosis not present

## 2017-05-10 DIAGNOSIS — M25551 Pain in right hip: Secondary | ICD-10-CM | POA: Diagnosis not present

## 2017-05-10 DIAGNOSIS — M5431 Sciatica, right side: Secondary | ICD-10-CM | POA: Diagnosis not present

## 2017-05-10 DIAGNOSIS — M79604 Pain in right leg: Secondary | ICD-10-CM | POA: Diagnosis not present

## 2017-05-11 DIAGNOSIS — M25551 Pain in right hip: Secondary | ICD-10-CM | POA: Diagnosis not present

## 2017-05-11 DIAGNOSIS — M5431 Sciatica, right side: Secondary | ICD-10-CM | POA: Diagnosis not present

## 2017-05-11 DIAGNOSIS — M79604 Pain in right leg: Secondary | ICD-10-CM | POA: Diagnosis not present

## 2017-05-16 DIAGNOSIS — M25551 Pain in right hip: Secondary | ICD-10-CM | POA: Diagnosis not present

## 2017-05-16 DIAGNOSIS — M5431 Sciatica, right side: Secondary | ICD-10-CM | POA: Diagnosis not present

## 2017-05-16 DIAGNOSIS — M79604 Pain in right leg: Secondary | ICD-10-CM | POA: Diagnosis not present

## 2017-05-17 DIAGNOSIS — M79604 Pain in right leg: Secondary | ICD-10-CM | POA: Diagnosis not present

## 2017-05-17 DIAGNOSIS — M25551 Pain in right hip: Secondary | ICD-10-CM | POA: Diagnosis not present

## 2017-05-17 DIAGNOSIS — M5431 Sciatica, right side: Secondary | ICD-10-CM | POA: Diagnosis not present

## 2017-05-18 DIAGNOSIS — M5431 Sciatica, right side: Secondary | ICD-10-CM | POA: Diagnosis not present

## 2017-05-18 DIAGNOSIS — M25551 Pain in right hip: Secondary | ICD-10-CM | POA: Diagnosis not present

## 2017-05-18 DIAGNOSIS — M79604 Pain in right leg: Secondary | ICD-10-CM | POA: Diagnosis not present

## 2017-06-27 DIAGNOSIS — I10 Essential (primary) hypertension: Secondary | ICD-10-CM | POA: Diagnosis not present

## 2017-06-27 DIAGNOSIS — K589 Irritable bowel syndrome without diarrhea: Secondary | ICD-10-CM | POA: Diagnosis not present

## 2017-06-27 DIAGNOSIS — E559 Vitamin D deficiency, unspecified: Secondary | ICD-10-CM | POA: Diagnosis not present

## 2017-06-27 DIAGNOSIS — J45909 Unspecified asthma, uncomplicated: Secondary | ICD-10-CM | POA: Diagnosis not present

## 2017-07-21 DIAGNOSIS — E7800 Pure hypercholesterolemia, unspecified: Secondary | ICD-10-CM | POA: Diagnosis not present

## 2017-09-05 DIAGNOSIS — L821 Other seborrheic keratosis: Secondary | ICD-10-CM | POA: Diagnosis not present

## 2017-09-05 DIAGNOSIS — Z85828 Personal history of other malignant neoplasm of skin: Secondary | ICD-10-CM | POA: Diagnosis not present

## 2017-09-05 DIAGNOSIS — L820 Inflamed seborrheic keratosis: Secondary | ICD-10-CM | POA: Diagnosis not present

## 2017-09-05 DIAGNOSIS — D225 Melanocytic nevi of trunk: Secondary | ICD-10-CM | POA: Diagnosis not present

## 2017-09-25 DIAGNOSIS — G540 Brachial plexus disorders: Secondary | ICD-10-CM | POA: Diagnosis not present

## 2017-09-25 DIAGNOSIS — M5412 Radiculopathy, cervical region: Secondary | ICD-10-CM | POA: Diagnosis not present

## 2017-09-25 DIAGNOSIS — M5441 Lumbago with sciatica, right side: Secondary | ICD-10-CM | POA: Diagnosis not present

## 2017-09-27 DIAGNOSIS — Z23 Encounter for immunization: Secondary | ICD-10-CM | POA: Diagnosis not present

## 2017-09-28 DIAGNOSIS — M5441 Lumbago with sciatica, right side: Secondary | ICD-10-CM | POA: Diagnosis not present

## 2017-09-28 DIAGNOSIS — M5412 Radiculopathy, cervical region: Secondary | ICD-10-CM | POA: Diagnosis not present

## 2017-09-28 DIAGNOSIS — G540 Brachial plexus disorders: Secondary | ICD-10-CM | POA: Diagnosis not present

## 2017-10-13 ENCOUNTER — Encounter: Payer: Self-pay | Admitting: General Practice

## 2017-10-17 NOTE — Progress Notes (Signed)
[facility] Intake Session  The client present to session with a sad mood and matching affect. The client was tearful throughout session.  The client expressed that she feels overwhelmed by a lack of information about her husband's cancer and prognosis. 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 The client reported that she is hoping to learn how to prepare herself for next steps through counseling. The client expressed that she often feels isolated in her experience, and she does not feel like she has people and/or activities in her life that support her own well-being.  The counselor provided empathetic presence and validation of the client's experience. The counselor and client will meet again on October 15 at 3 pm.  Doris Cheadle, Counseling Intern [phone]

## 2017-10-23 DIAGNOSIS — G540 Brachial plexus disorders: Secondary | ICD-10-CM | POA: Diagnosis not present

## 2017-10-23 DIAGNOSIS — M5412 Radiculopathy, cervical region: Secondary | ICD-10-CM | POA: Diagnosis not present

## 2017-10-23 DIAGNOSIS — M5441 Lumbago with sciatica, right side: Secondary | ICD-10-CM | POA: Diagnosis not present

## 2017-10-24 NOTE — Progress Notes (Signed)
[facility] Session  The client presented to session with a sad mood and matching affect.  The client expressed that with her husband beginning chemo in the next week, she is wondering what changes may occur with his health. The client shared that she is fearful of the unknown and has been feeling helpless due to lack of a clear timeline for his health. The client expressed that she feels responsible to notice changes in his health so that she can make sure he receives proper treatment. The client shared that she has gone back to work after some time off, and she expressed that she welcomes the distraction, though she feels some fearfulness and guilt toward spending time away from her husband. The client expressed difficulty with giving herself permission to acknowledge her own needs and emotions.  The counselor and client discussed the client's fear of the unknown. The counselor provided empathetic presence and validation. The counselor and client also reviewed end of life resources that the client expressed interest in. The client will follow up to schedule another appointment via phone in the next few weeks.  Doris Cheadle, Counseling Intern [phone]

## 2017-10-25 DIAGNOSIS — G540 Brachial plexus disorders: Secondary | ICD-10-CM | POA: Diagnosis not present

## 2017-10-25 DIAGNOSIS — M5441 Lumbago with sciatica, right side: Secondary | ICD-10-CM | POA: Diagnosis not present

## 2017-10-25 DIAGNOSIS — M5412 Radiculopathy, cervical region: Secondary | ICD-10-CM | POA: Diagnosis not present

## 2017-10-30 DIAGNOSIS — M5412 Radiculopathy, cervical region: Secondary | ICD-10-CM | POA: Diagnosis not present

## 2017-10-30 DIAGNOSIS — G540 Brachial plexus disorders: Secondary | ICD-10-CM | POA: Diagnosis not present

## 2017-10-30 DIAGNOSIS — M5441 Lumbago with sciatica, right side: Secondary | ICD-10-CM | POA: Diagnosis not present

## 2017-11-03 DIAGNOSIS — G540 Brachial plexus disorders: Secondary | ICD-10-CM | POA: Diagnosis not present

## 2017-11-03 DIAGNOSIS — M5441 Lumbago with sciatica, right side: Secondary | ICD-10-CM | POA: Diagnosis not present

## 2017-11-03 DIAGNOSIS — M5412 Radiculopathy, cervical region: Secondary | ICD-10-CM | POA: Diagnosis not present

## 2017-11-09 DIAGNOSIS — M5441 Lumbago with sciatica, right side: Secondary | ICD-10-CM | POA: Diagnosis not present

## 2017-11-09 DIAGNOSIS — M5412 Radiculopathy, cervical region: Secondary | ICD-10-CM | POA: Diagnosis not present

## 2017-11-09 DIAGNOSIS — G540 Brachial plexus disorders: Secondary | ICD-10-CM | POA: Diagnosis not present

## 2018-02-16 DIAGNOSIS — Z0000 Encounter for general adult medical examination without abnormal findings: Secondary | ICD-10-CM | POA: Diagnosis not present

## 2018-02-16 DIAGNOSIS — E7800 Pure hypercholesterolemia, unspecified: Secondary | ICD-10-CM | POA: Diagnosis not present

## 2018-02-22 DIAGNOSIS — M5416 Radiculopathy, lumbar region: Secondary | ICD-10-CM | POA: Diagnosis not present

## 2018-02-22 DIAGNOSIS — M5412 Radiculopathy, cervical region: Secondary | ICD-10-CM | POA: Diagnosis not present

## 2018-02-28 DIAGNOSIS — Z01419 Encounter for gynecological examination (general) (routine) without abnormal findings: Secondary | ICD-10-CM | POA: Diagnosis not present

## 2018-02-28 DIAGNOSIS — Z6834 Body mass index (BMI) 34.0-34.9, adult: Secondary | ICD-10-CM | POA: Diagnosis not present

## 2020-04-01 ENCOUNTER — Other Ambulatory Visit: Payer: Self-pay | Admitting: Obstetrics & Gynecology

## 2020-04-01 DIAGNOSIS — N6310 Unspecified lump in the right breast, unspecified quadrant: Secondary | ICD-10-CM

## 2020-05-13 ENCOUNTER — Ambulatory Visit
Admission: RE | Admit: 2020-05-13 | Discharge: 2020-05-13 | Disposition: A | Discharge status: Home / Self Care | Payer: 59 | Source: Ambulatory Visit | Attending: Obstetrics & Gynecology | Admitting: Obstetrics & Gynecology

## 2020-05-13 ENCOUNTER — Other Ambulatory Visit: Payer: Self-pay

## 2020-05-13 DIAGNOSIS — N6310 Unspecified lump in the right breast, unspecified quadrant: Secondary | ICD-10-CM

## 2021-05-10 ENCOUNTER — Other Ambulatory Visit: Payer: Self-pay | Admitting: Obstetrics and Gynecology

## 2021-05-10 DIAGNOSIS — R928 Other abnormal and inconclusive findings on diagnostic imaging of breast: Secondary | ICD-10-CM

## 2021-05-20 ENCOUNTER — Ambulatory Visit
Admission: RE | Admit: 2021-05-20 | Discharge: 2021-05-20 | Disposition: A | Discharge status: Home / Self Care | Payer: 59 | Source: Ambulatory Visit | Attending: Obstetrics and Gynecology | Admitting: Obstetrics and Gynecology

## 2021-05-20 ENCOUNTER — Ambulatory Visit: Payer: 59

## 2021-05-20 DIAGNOSIS — R928 Other abnormal and inconclusive findings on diagnostic imaging of breast: Secondary | ICD-10-CM

## 2022-08-22 ENCOUNTER — Encounter: Payer: Self-pay | Admitting: Dietician

## 2022-08-22 ENCOUNTER — Encounter: Payer: 59 | Attending: Physician Assistant | Admitting: Dietician

## 2022-08-22 VITALS — Ht 63.5 in | Wt 179.0 lb

## 2022-08-22 DIAGNOSIS — E669 Obesity, unspecified: Secondary | ICD-10-CM | POA: Insufficient documentation

## 2022-08-22 NOTE — Progress Notes (Signed)
Medical Nutrition Therapy  Appointment Start time:  8:00  Appointment End time:  9:07  Primary concerns today: trying to lose weight, stating she has had a hard time  Referral diagnosis: Obesity, unspecified Preferred learning style: no preference indicated (auditory, visual, hands on, no preference indicated) Learning readiness: ready (not ready, contemplating, ready, change in progress)  NUTRITION ASSESSMENT   Anthropometrics  Weight: 179.0 lb Height:  63.5 in  Body Composition Scale 08/22/2022  Current Body Weight 179.0  Total Body Fat % 38.2  Visceral Fat 11  Fat-Free Mass % 61.7   Total Body Water % 45.3  Muscle-Mass lbs 28.9  BMI 30.9  Body Fat Displacement          Torso  lbs 42.3         Left Leg  lbs 8.4         Right Leg  lbs 8.4         Left Arm  lbs 4.2         Right Arm  lbs 4.2   Clinical Medical Hx: IBS, GERD, asthma, HTN, hypercholesterolemia Medications: reviewed Labs: triglycerides 168; cholesterol 230; LDL 136; HDL 65 Notable Signs/Symptoms: none noted  Lifestyle & Dietary Hx  Pt states she tries to watch what she eats, stating it is hard to do. Pt states she had a hysterectomy in 2008, stating that is when she gained a lot of weight. Pt states she had a IBS flare up last week, stating and a vertigo episode at the same time. Pt states IBS flares up when she gets stressed. Pt states the IBS medication stops her up. 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 Pt states she only takes half of her blood pressure pill, stating she doesn't need as much anymore. Pt states she doesn't think physical activity is doing anything for her weight loss.  Estimated daily fluid intake: 60 oz Supplements: vit E, Vit D, vit B12, multivitamin, fish oil, Calcium, fiber supplement  (fibercon) Sleep: varies, stating she will usually wake at least once or twice during the night. Stress / self-care: stress level is a level 8  (scale of 1-10) this week. Current average weekly physical activity: walking, twice daily, 15-30 minutes each time (mornings 30 minutes).  24-Hr Dietary Recall First Meal: cereal (cheerios) blueberries, coffee Snack:  Second Meal: salad or baked chicken with 2 vegetables Snack: snack mix (nuts and sesame sticks) Third Meal: baked chicken with vegetables or salad or fruit or [place] sandwich with cheese Snack:  Beverages: water, coffee  Estimated Energy Needs Calories: 1600  NB-1.1 Food and nutrition-related knowledge deficit As related to lack of nutrition related education. As evidenced by excess weight.  NUTRITION INTERVENTION  Nutrition education (E-1) on the following topics:  Fruits & Vegetables: Aim to fill half your plate with a variety of fruits and vegetables. They are rich in vitamins, minerals, and fiber, and can help reduce the risk of chronic diseases. Choose a colorful assortment of fruits and vegetables to ensure you get a wide range of nutrients. Grains and Starches: Make at least half of your grain choices whole grains, such as Kirsten Mckone rice, whole wheat bread, and oats. Whole grains provide fiber, which aids in digestion and healthy cholesterol levels. Aim for whole forms of starchy vegetables such as potatoes, sweet potatoes, beans, peas, and corn, which are fiber rich and provide many vitamins and minerals.  Protein: Incorporate lean sources of protein,  such as poultry, fish, beans, nuts, and seeds, into your meals. Protein is essential for building and repairing tissues, staying full, balancing blood sugar, as well as supporting immune function. Dairy: Include low-fat or fat-free dairy products like milk, yogurt, and cheese in your diet. Dairy foods are excellent sources of calcium and vitamin D, which are crucial for bone health.  Physical Activity: Aim for 150 minutes of physical activity each week. Regular physical activity promotes overall health-including helping to  reduce risk for heart disease and diabetes, promoting mental health, and helping [place] sleep better.  Discussed the health benefits of physical activity.  Handouts Provided Include  Types of fat (saturated vs unsaturated) Meal Ideas IBS handout (from Diet Manual) Health Benefits of Physical Activity  Learning Style & Readiness for Change Teaching method utilized: Visual & Auditory  Demonstrated degree of understanding via: Teach Back  Barriers to learning/adherence to lifestyle change: nothing identified  Goals Established by Pt Aim for 2 or more servings of non-starchy vegetables a day Choose complex carbohydrates that are higher in fiber and lower in sugar (whole grains, whole fruits) Continue to work on meal planning and prep, especially snacks Aim for protein with each meal and snack Work on Optician, dispensing; find non-food related activities  MONITORING & EVALUATION Dietary intake, weekly physical activity.  Next Steps  Patient is to follow-up in 3 months.

## 2022-11-21 ENCOUNTER — Encounter: Payer: 59 | Attending: Family Medicine | Admitting: Dietician

## 2022-11-21 ENCOUNTER — Encounter: Payer: Self-pay | Admitting: Dietician

## 2022-11-21 VITALS — Ht 63.5 in | Wt 182.8 lb

## 2022-11-21 DIAGNOSIS — E669 Obesity, unspecified: Secondary | ICD-10-CM | POA: Diagnosis present

## 2022-11-21 NOTE — Progress Notes (Signed)
Medical Nutrition Therapy  Appointment Start time:  8:00  Appointment End time:  9:07  Primary concerns today: trying to lose weight, stating she has had a hard time  Referral diagnosis: Obesity, unspecified Preferred learning style: no preference indicated (auditory, visual, hands on, no preference indicated) Learning readiness: ready (not ready, contemplating, ready, change in progress)  NUTRITION ASSESSMENT   Anthropometrics  Start weight at [facility]: 179.0 lb Height:  63.5 in Weight today: 182.8 lb   Body Composition Scale 08/22/2022 11/21/2022  Current Body Weight 179.0 182.8  Total Body Fat % 38.2 39.2  Visceral Fat 11 12  Fat-Free Mass % 61.7 60.7   Total Body Water % 45.3 44.8  Muscle-Mass lbs 28.9 28.7  BMI 30.9 32.0  Body Fat Displacement           Torso  lbs 42.3 44.2         Left Leg  lbs 8.4 8.8         Right Leg  lbs 8.4 8.8         Left Arm  lbs 4.2 4.4         Right Arm  lbs 4.2 4.4   Clinical Medical Hx: IBS, GERD, asthma, HTN, hypercholesterolemia Medications: reviewed Labs: triglycerides 168; cholesterol 230; LDL 136; HDL 65 Notable Signs/Symptoms: none noted  Lifestyle & Dietary Hx  Pt states she does not remember the goals she was working on. Pt states she is starting physical therapy through the [place], for weight loss. Pt states she can not do crunches, due to her back.  Estimated daily fluid intake: 60 oz Supplements: vit E, Vit D, vit B12, multivitamin, fish oil, Calcium, fiber supplement  (fibercon) Sleep: varies, stating she will usually wake at least once or twice during the night. Stress / self-care: stress level is a level 8 (scale of 1-10) this week. Current average weekly physical activity: walking daily, twice daily, 15-30 minutes each time (mornings 30 minutes).  24-Hr Dietary Recall First Meal: cereal (cheerios) blueberries, coffee or oatmeal Snack:  Second Meal: salad or baked chicken with 2 vegetables or soup Snack: snack mix (nuts and  sesame sticks) or popcorn Third Meal: baked chicken with vegetables or salad or fruit or frozen dinner Snack: popcorn Beverages: water, coffee  Estimated Energy Needs Calories: 1600  NB-1.1 Food and nutrition-related knowledge deficit As related to lack of nutrition related education. As evidenced by excess weight.  NUTRITION INTERVENTION  Nutrition education (E-1) on the following topics:  (reviewed) Fruits & Vegetables: Aim to fill half your plate with a variety of fruits and vegetables. They are rich in vitamins, minerals, and fiber, and can help reduce the risk of chronic diseases. Choose a colorful assortment of fruits and vegetables to ensure you get a wide range of nutrients. Grains and Starches: Make at least half of your grain choices whole grains, such as Layni Kreamer rice, whole wheat bread, and oats. Whole grains provide fiber, which aids in digestion and healthy cholesterol levels. Aim for whole forms of starchy vegetables such as potatoes, sweet potatoes, beans, peas, and corn, which are fiber rich and provide many vitamins and minerals.  Protein: Incorporate lean sources of protein, such as poultry, fish, beans, nuts, and seeds, into your meals. Protein is essential for building and repairing tissues, staying full, balancing blood sugar, as well as supporting immune function. Dairy: Include low-fat or fat-free dairy products like milk, yogurt, and cheese in your diet. Dairy foods are excellent sources of calcium and vitamin D,  which are crucial for bone health.  Physical Activity: Aim for 150 minutes of physical activity each week. Regular physical activity promotes overall health-including helping to reduce risk for heart disease and diabetes, promoting mental health, and helping [place] sleep better.  Discussed the health benefits of physical activity.  Handouts Provided Include  Meal Ideas Goals Printed  Learning Style & Readiness for Change Teaching method utilized: Visual & Auditory   Demonstrated degree of understanding via: Teach Back  Barriers to learning/adherence to lifestyle change: nothing identified  Goals  Continue: Aim for 2 or more servings of non-starchy vegetables a day Continue: Choose complex carbohydrates that are higher in fiber and lower in sugar (whole grains, whole fruits) Continue: to work on meal planning and prep, especially snacks Continue: Aim for protein with each meal and snack New: track protein; 60 grams a day New: incorporate resistance training Continue: Work on Optician, dispensing; find non-food related activities  MONITORING & EVALUATION Dietary intake, weekly physical activity.  Next Steps  Patient is to follow-up in 3 months.

## 2023-04-25 ENCOUNTER — Other Ambulatory Visit

## 2023-05-03 ENCOUNTER — Other Ambulatory Visit: Payer: Self-pay | Admitting: Genetic Counselor

## 2023-05-03 ENCOUNTER — Inpatient Hospital Stay

## 2023-05-03 ENCOUNTER — Encounter: Payer: Self-pay | Admitting: Genetic Counselor

## 2023-05-03 ENCOUNTER — Inpatient Hospital Stay: Attending: Genetic Counselor | Admitting: Genetic Counselor

## 2023-05-03 DIAGNOSIS — Z803 Family history of malignant neoplasm of breast: Secondary | ICD-10-CM

## 2023-05-03 DIAGNOSIS — Z800 Family history of malignant neoplasm of digestive organs: Secondary | ICD-10-CM

## 2023-05-03 DIAGNOSIS — Z8052 Family history of malignant neoplasm of bladder: Secondary | ICD-10-CM

## 2023-05-03 LAB — GENETIC SCREENING ORDER

## 2023-05-03 NOTE — Progress Notes (Signed)
 REFERRING PROVIDER: Rae Bugler, MD [phone] Christina Watson [address] [place],  [place] [address]  PRIMARY PROVIDER:  Rae Bugler, MD  PRIMARY REASON FOR VISIT:  1. Family history of breast cancer   2. Family history of colon cancer   3. Family history of bladder cancer      HISTORY OF PRESENT ILLNESS:   Christina Watson, a 58 y.o. female, was seen for a [facility] cancer genetics consultation at the request of Dr. Janifer Meigs due to a family history of cancer.  Christina Watson presents to clinic today to discuss the possibility of a hereditary predisposition to cancer, genetic testing, and to further clarify her future cancer risks, as well as potential cancer risks for family members.   Christina Watson is a 58 y.o. female with no personal history of cancer, with the exception of non-melanoma skin cancer.    CANCER HISTORY:  Oncology History   No history exists.     RISK FACTORS:  Menarche was at age 29.  First live birth at age 33.  OCP use for approximately  5-10  years.  Ovaries intact: yes.  Hysterectomy: yes.  Menopausal status: postmenopausal.  HRT use: 3 years. Colonoscopy: yes; normal. Mammogram within the last year: yes. Number of breast biopsies: 0. Up to date with pelvic exams: yes. Any excessive radiation exposure in the past: no  Past Medical History:  Diagnosis Date   Asthma    Family history of bladder cancer    Family history of breast cancer    Family history of colon cancer    Hypertension    Irritable bowel disease     Past Surgical History:  Procedure Laterality Date   ABDOMINAL HYSTERECTOMY     CARPAL TUNNEL RELEASE Left 02/02/2017   Procedure: LEFT CARPAL TUNNEL RELEASE;  Surgeon: Brunilda Capra, MD;  Location: [facility];  Service: Orthopedics;  Laterality: Left;    Social History   Socioeconomic History   Marital status: Widowed    Spouse name: Not on file   Number of children: Not on file   Years of education: Not on file   Highest  education level: Not on file  Occupational History   Not on file  Tobacco Use   Smoking status: Never   Smokeless tobacco: Never  Vaping Use   Vaping status: Never Used  Substance and Sexual Activity   Alcohol use: No   Drug use: No   Sexual activity: Not on file  Other Topics Concern   Not on file  Social History Narrative   Not on file   Social Drivers of Health   Financial Resource Strain: Not on file  Food Insecurity: Not on file  Transportation Needs: Not on file  Physical Activity: Not on file  Stress: Not on file  Social Connections: Not on file     FAMILY HISTORY:  We obtained a detailed, 4-generation family history.  Significant diagnoses are listed below: Family History  Problem Relation Age of Onset   Diabetes Mother    Bladder Cancer Mother 40   Breast cancer Mother 36       AGE 20   Lung cancer Father    Colon cancer Father 22   Diabetes Sister    Non-Hodgkin's lymphoma Sister 57   Skin cancer Sister    Lymphoma Maternal Aunt    Dementia Maternal Aunt    Skin cancer Maternal Uncle    Dementia Maternal Grandmother    Heart disease Maternal Grandfather  Liver disease Paternal Grandmother       The patient has two children who are cancer free. Her sister has non-hodgkin's lymphoma.  Her father is deceased and her mother is living.  The patient's father had colon cancer at 39 and lung cancer at 50.  He has two half siblings who are cancer free.  His parents are deceased.  The patient's mother had breast cancer at 47 and bladder cancer at 81.  She has a sister with lymphoma.    Christina Watson is unaware of previous family history of genetic testing for hereditary cancer risks. There is no reported Ashkenazi Jewish ancestry. There is no known consanguinity.  GENETIC COUNSELING ASSESSMENT: Christina Watson is a 58 y.o. female with a family history of cancer which is somewhat suggestive of a familial or sporadic predisposition to cancer given the type of  cancer in the family and ages of onset. We, therefore, discussed and recommended the following at today's visit.   DISCUSSION: We discussed that, in general, most cancer is not inherited in families, but instead is sporadic or familial. Sporadic cancers occur by chance and typically happen at older ages (>50 years) as this type of cancer is caused by genetic changes acquired during an individual's lifetime. Some families have more cancers than would be expected by chance; however, the ages or types of cancer are not consistent with a known genetic mutation or known genetic mutations have been ruled out. This type of familial cancer is thought to be due to a combination of multiple genetic, environmental, hormonal, and lifestyle factors. While this combination of factors likely increases the risk of cancer, the exact source of this risk is not currently identifiable or testable.  We discussed that 5 - 10% of breast and/or colon cancer is hereditary.  Many of the cancers in Christina Watson's family are cancers that are less likely to be hereditary.  We discussed with Christina Watson that the family history does not meet insurance or NCCN criteria for genetic testing and, therefore, is not highly consistent with a familial hereditary cancer syndrome.  We feel she is at low risk to harbor  a gene mutation associated with such a condition. Christina Watson decided to pursue testing anyway.  We discussed that the patient pay cost for testing is $249.  She voiced her understanding.   We reviewed the characteristics, features and inheritance patterns of hereditary cancer syndromes. We also discussed genetic testing, including the appropriate family members to test, the process of testing, insurance coverage and turn-around-time for results. We discussed the implications of a negative, positive, carrier and/or variant of uncertain significant result. Christina Watson  was offered a common hereditary cancer panel (36+ genes) and an  expanded pan-cancer panel (70+ genes). Christina Watson was informed of the benefits and limitations of each panel, including that expanded pan-cancer panels contain genes that do not have clear management guidelines at this point in time.  We also discussed that as the number of genes included on a panel increases, the chances of variants of uncertain significance increases. Christina Watson decided to pursue genetic testing for the CancerNext-Expanded+RNAinsight gene panel.   The CancerNext-Expanded gene panel offered by [facility] and includes sequencing, rearrangement, and RNA analysis for the following 76 genes: AIP, ALK, APC, ATM, AXIN2, BAP1, BARD1, BMPR1A, BRCA1, BRCA2, BRIP1, CDC73, CDH1, CDK4, CDKN1B, CDKN2A, CEBPA, CHEK2, CTNNA1, DDX41, DICER1, ETV6, FH, FLCN, GATA2, LZTR1, MAX, MBD4, MEN1, MET, MLH1, MSH2, MSH3, MSH6, MUTYH, NF1, NF2, NTHL1, PALB2, PHOX2B, PMS2, POT1,  PRKAR1A, PTCH1, PTEN, RAD51C, RAD51D, RB1, RET, RUNX1, SDHA, SDHAF2, SDHB, SDHC, SDHD, SMAD4, SMARCA4, SMARCB1, SMARCE1, STK11, SUFU, TMEM127, TP53, TSC1, TSC2, VHL, and WT1 (sequencing and deletion/duplication); EGFR, HOXB13, KIT, MITF, PDGFRA, POLD1, and POLE (sequencing only); EPCAM and GREM1 (deletion/duplication only).    We discussed that some people do not want to undergo genetic testing due to fear of genetic discrimination.  The Genetic Information Nondiscrimination Act (GINA) was signed into federal law in 2008. GINA prohibits health insurers and most employers from discriminating against individuals based on genetic information (including the results of genetic tests and family history information). According to GINA, health insurance companies cannot consider genetic information to be a preexisting condition, nor can they use it to make decisions regarding coverage or rates. GINA also makes it illegal for most employers to use genetic information in making decisions about hiring, firing, promotion, or terms of employment. It is  important to note that GINA does not offer protections for life insurance, disability insurance, or long-term care insurance. GINA does not apply to those in the [employer], those who work for companies with less than 15 employees, and new life insurance or long-term disability insurance policies.  Health status due to a cancer diagnosis is not protected under GINA. More information about GINA can be found by visiting [email or web address].  The Tyrer-Cuzick model is one of multiple prediction models developed to estimate an individual's lifetime risk of developing breast cancer. The Tyrer-Cuzick model is endorsed by the [employer] (NCCN). This model includes many risk factors such as family history, endogenous estrogen exposure, and benign breast disease. The calculation is highly-dependent on the accuracy of clinical data provided by the patient and can change over time. The Tyrer-Cuzick model may be repeated to reflect new information in her personal or family history in the future.   Based on the patient's family history of breast cancer, a statistical model (Tyrer Cusik) was used to estimate her risk of developing breast cancer. This estimates her lifetime risk of developing breast cancer to be approximately 15.5%. This estimation does not consider any genetic testing results.  The patient's lifetime breast cancer risk is a preliminary estimate based on available information using one of several models endorsed by the American Cancer Society (ACS). The ACS recommends consideration of breast MRI screening as an adjunct to mammography for patients at high risk (defined as 20% or greater lifetime risk). Please note that a woman's breast cancer risk changes over time. It may increase or decrease based on age and any changes to the personal and/or family medical history. The risks and recommendations listed above apply to this patient at this point in time. In the future, she may or may not  be eligible for the same medical management strategies and, in some cases, other medical management strategies may become available to her. If she is interested in an updated breast cancer risk assessment at a later date, she can contact us .    PLAN: After considering the risks, benefits, and limitations, Christina Watson provided informed consent to pursue genetic testing and the blood sample was sent to [facility] for analysis of the CancerNext-Expanded+RNAinsight. Results should be available within approximately 2-3 weeks' time, at which point they will be disclosed by telephone to Christina Watson, as will any additional recommendations warranted by these results. Christina Watson will receive a summary of her genetic counseling visit and a copy of her results once available. This information will also be available in Epic.  Lastly, we encouraged Christina Watson to remain in contact with cancer genetics annually so that we can continuously update the family history and inform her of any changes in cancer genetics and testing that may be of benefit for this family.   Christina Watson questions were answered to her satisfaction today. Our contact information was provided should additional questions or concerns arise. Thank you for the referral and allowing us  to share in the care of your patient.   Kyzer Blowe P. [place], MS, CGC Licensed, Patent attorney [email or web address] phone: [phone]  60 minutes were spent on the date of the encounter in service to the patient including preparation, face-to-face consultation, documentation and care coordination.  The patient was seen alone. Drs. Johnna Nakai, and/or Gudena were available for questions, if needed..    _______________________________________________________________________ For Office Staff:  Number of people involved in session: 1 Was an Intern/ student involved with Watson: no

## 2023-05-22 ENCOUNTER — Ambulatory Visit: Payer: Self-pay | Admitting: Genetic Counselor

## 2023-05-22 ENCOUNTER — Encounter: Payer: Self-pay | Admitting: Genetic Counselor

## 2023-05-22 ENCOUNTER — Telehealth: Payer: Self-pay | Admitting: Genetic Counselor

## 2023-05-22 DIAGNOSIS — Z1379 Encounter for other screening for genetic and chromosomal anomalies: Secondary | ICD-10-CM | POA: Insufficient documentation

## 2023-05-22 NOTE — Telephone Encounter (Signed)
Revealed negative genetic testing.  Discussed that we do not know why there is cancer in the family. It could be due to a different gene that we are not testing, or maybe our current technology may not be able to pick something up.  It will be important for her to keep in contact with genetics to keep up with whether additional testing may be needed.  

## 2023-05-22 NOTE — Progress Notes (Signed)
 HPI:  Christina Watson was previously seen in the [facility] due to a family history of cancer and concerns regarding a hereditary predisposition to cancer. Please refer to our prior cancer genetics clinic note for more information regarding our discussion, assessment and recommendations, at the time. Christina Watson recent genetic test results were disclosed to her, as were recommendations warranted by these results. These results and recommendations are discussed in more detail below.  CANCER HISTORY:  Oncology History   No history exists.    FAMILY HISTORY:  We obtained a detailed, 4-generation family history.  Significant diagnoses are listed below: Family History  Problem Relation Age of Onset   Diabetes Mother    Bladder Cancer Mother 65   Breast cancer Mother 56       AGE 59   Lung cancer Father    Colon cancer Father 26   Diabetes Sister    Non-Hodgkin's lymphoma Sister 65   Skin cancer Sister    Lymphoma Maternal Aunt    Dementia Maternal Aunt    Skin cancer Maternal Uncle    Dementia Maternal Grandmother    Heart disease Maternal Grandfather    Liver disease Paternal Grandmother          The patient has two children who are cancer free. Her sister has non-hodgkin's lymphoma.  Her father is deceased and her mother is living.   The patient's father had colon cancer at 67 and lung cancer at 6.  He has two half siblings who are cancer free.  His parents are deceased.   The patient's mother had breast cancer at 34 and bladder cancer at 14.  She has a sister with lymphoma.     Christina Watson is unaware of previous family history of genetic testing for hereditary cancer risks. There is no reported Ashkenazi Jewish ancestry. There is no known consanguinity  GENETIC TEST RESULTS: Genetic testing reported out on May 18, 2023 through the CancerNext-Expanded+RNAinsight cancer panel found no pathogenic mutations. The CancerNext-Expanded gene panel offered by [facility] and includes sequencing, rearrangement, and RNA analysis for the following 77 genes: AIP, ALK, APC, ATM, BAP1, BARD1, BMPR1A, BRCA1, BRCA2, BRIP1, CDC73, CDH1, CDK4, CDKN1B, CDKN2A, CEBPA, CHEK2, CTNNA1, DDX41, DICER1, ETV6, FH, FLCN, GATA2, LZTR1, MAX, MBD4, MEN1, MET, MLH1, MSH2, MSH3, MSH6, MUTYH, NF1, NF2, NTHL1, PALB2, PHOX2B, PMS2, POT1, PRKAR1A, PTCH1, PTEN, RAD51C, RAD51D, RB1, RET, RPS20, RUNX1, SDHA, SDHAF2, SDHB, SDHC, SDHD, SMAD4, SMARCA4, SMARCB1, SMARCE1, STK11, SUFU, TMEM127, TP53, TSC1, TSC2, VHL, and WT1 (sequencing and deletion/duplication); AXIN2, CTNNA1, DDX41, EGFR, HOXB13, KIT, MBD4, MITF, MSH3, PDGFRA, POLD1 and POLE (sequencing only); EPCAM and GREM1 (deletion/duplication only). RNA data is routinely analyzed for use in variant interpretation for all genes. The test report has been scanned into EPIC and is located under the Molecular Pathology section of the Results Review tab.  A portion of the result report is included below for reference.     We discussed with Christina Watson that because current genetic testing is not perfect, it is possible there may be a gene mutation in one of these genes that current testing cannot detect, but that chance is small.  We also discussed, that there could be another gene that has not yet been discovered, or that we have not yet tested, that is responsible for the cancer diagnoses in the family. It is also possible there is a hereditary cause for the cancer in the family that Christina Watson did not inherit and therefore was  not identified in her testing.  Therefore, it is important to remain in touch with cancer genetics in the future so that we can continue to offer Christina Watson the most up to date genetic testing.   ADDITIONAL GENETIC TESTING: We discussed with Christina Watson that her genetic testing was fairly extensive.  If there are genes identified to increase cancer risk that can be analyzed in the future, we would be happy to discuss and  coordinate this testing at that time.    CANCER SCREENING RECOMMENDATIONS: Christina Watson's test result is considered negative (normal).  This means that we have not identified a hereditary cause for her family history of cancer at this time.     Possible reasons for Christina Watson's negative genetic test include:  1. There may be a gene mutation in one of these genes that current testing methods cannot detect but that chance is small.  2. There could be another gene that has not yet been discovered, or that we have not yet tested, that is responsible for the cancer diagnoses in the family.  3.  There may be no hereditary risk for cancer in the family. The cancers in Christina Watson and/or her family may be sporadic/familial or due to other genetic and environmental factors. 4. It is also possible there is a hereditary cause for the cancer in the family that Christina Watson did not inherit.  Therefore, it is recommended she continue to follow the cancer management and screening guidelines provided by her primary healthcare provider. An individual's cancer risk and medical management are not determined by genetic test results alone. Overall cancer risk assessment incorporates additional factors, including personal medical history, family history, and any available genetic information that may result in a personalized plan for cancer prevention and surveillance  RECOMMENDATIONS FOR FAMILY MEMBERS:   Since she did not inherit a identifiable mutation in a cancer predisposition gene included on this panel, her children could not have inherited a known mutation from her in one of these genes. Individuals in this family might be at some increased risk of developing cancer, over the general population risk, simply due to the family history of cancer.  We recommended women in this family have a yearly mammogram beginning at age 58, or 27 years younger than the earliest onset of cancer, an annual clinical breast exam, and  perform monthly breast self-exams. Women in this family should also have a gynecological exam as recommended by their primary provider. All family members should be referred for colonoscopy starting at age 32, or 60 years younger than the earliest onset of cancer.  FOLLOW-UP: Lastly, we discussed with Christina Watson that cancer genetics is a rapidly advancing field and it is possible that new genetic tests will be appropriate for her and/or her family members in the future. We encouraged her to remain in contact with cancer genetics on an annual basis so we can update her personal and family histories and let her know of advances in cancer genetics that may benefit this family.   Our contact number was provided. Christina Watson questions were answered to her satisfaction, and she knows she is welcome to call us  at anytime with additional questions or concerns.   Marijo Shove, MS, [facility] Licensed, Certified Genetic Counselor [email or web address]

## 2023-06-29 IMAGING — MG MM DIGITAL DIAGNOSTIC UNILAT*R* W/ TOMO W/ CAD
6 series · 6 of 18 positions shown · non-contrast
Comparison: Previous exam(s).

CLINICAL DATA: Recall from screening mammography, possible
asymmetry in the outer RIGHT breast.

EXAM:
DIGITAL DIAGNOSTIC UNILATERAL RIGHT MAMMOGRAM WITH TOMOSYNTHESIS AND
CAD
TECHNIQUE: Right digital diagnostic mammography and breast tomosynthesis was
performed. The images were evaluated with computer-aided detection.

[R MLO synth-2D (1 of 2)]
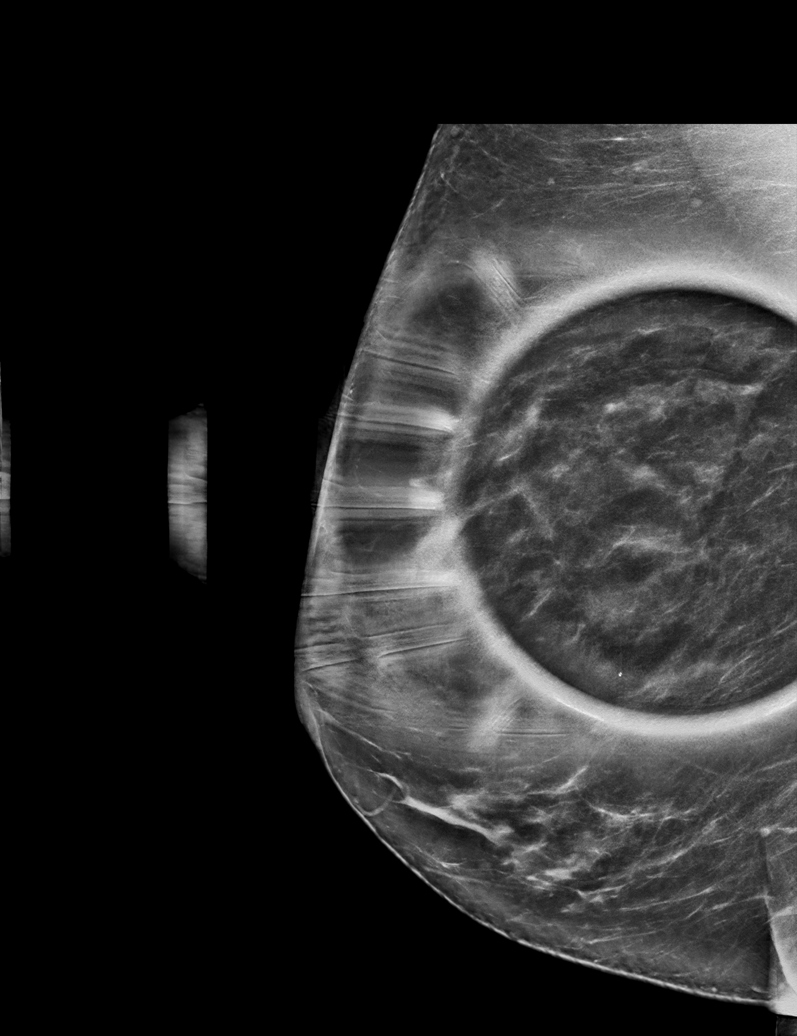

[R CC synth-2D]
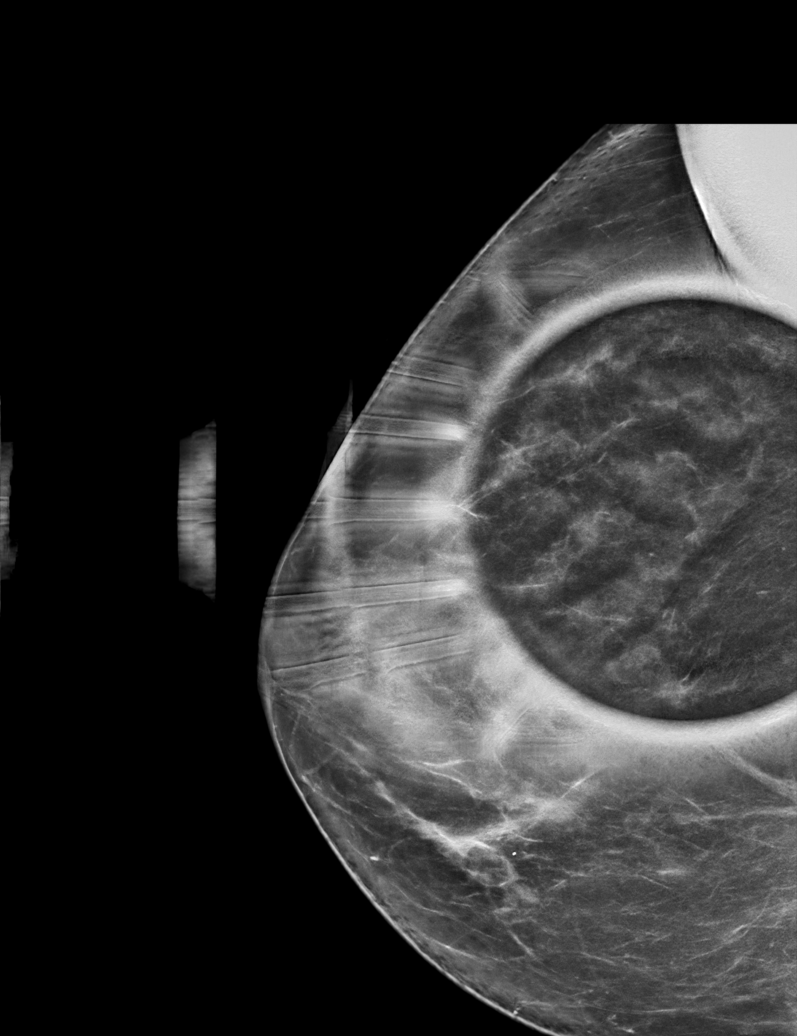

[R MLO synth-2D (2 of 2)]
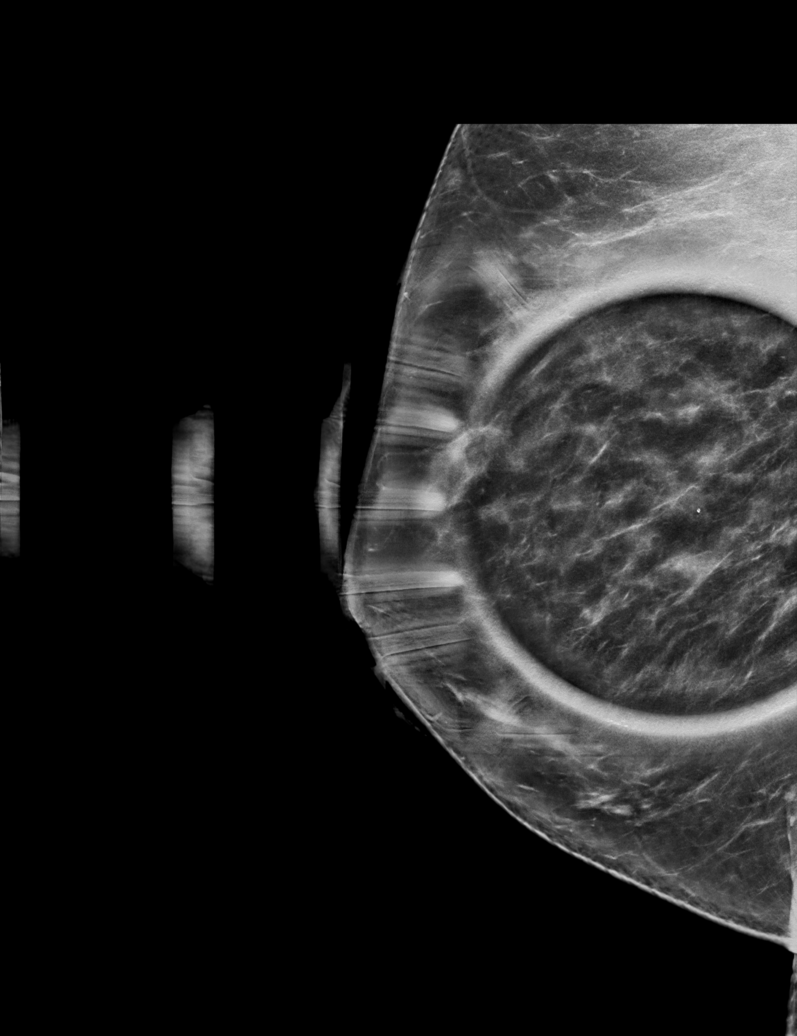

[R CC tomo · tomo slice 41/82.0]
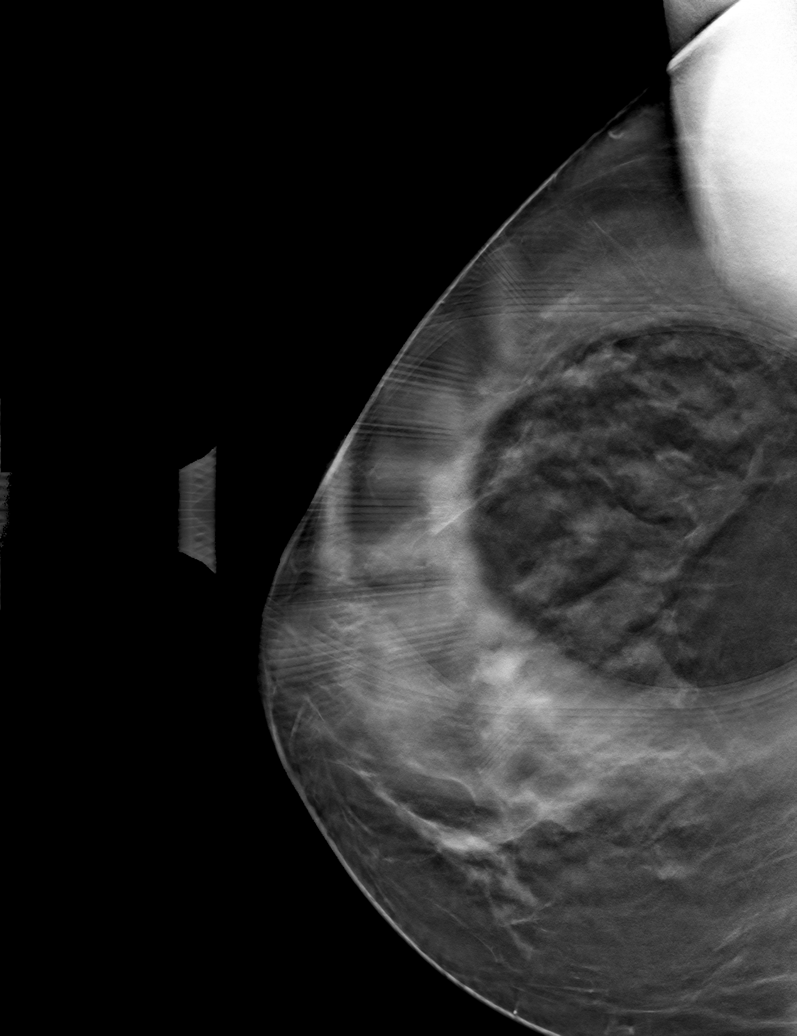

[R MLO tomo (1 of 2) · tomo slice 43/86.0]
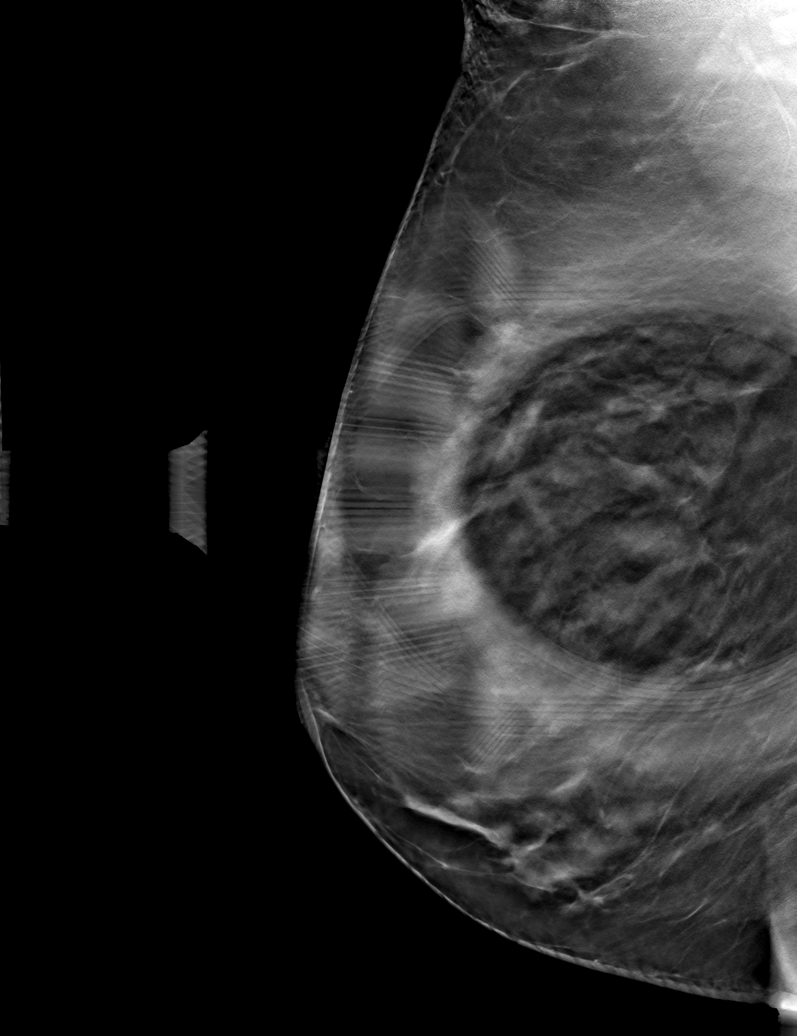

[R MLO tomo (2 of 2) · tomo slice 41/82.0]
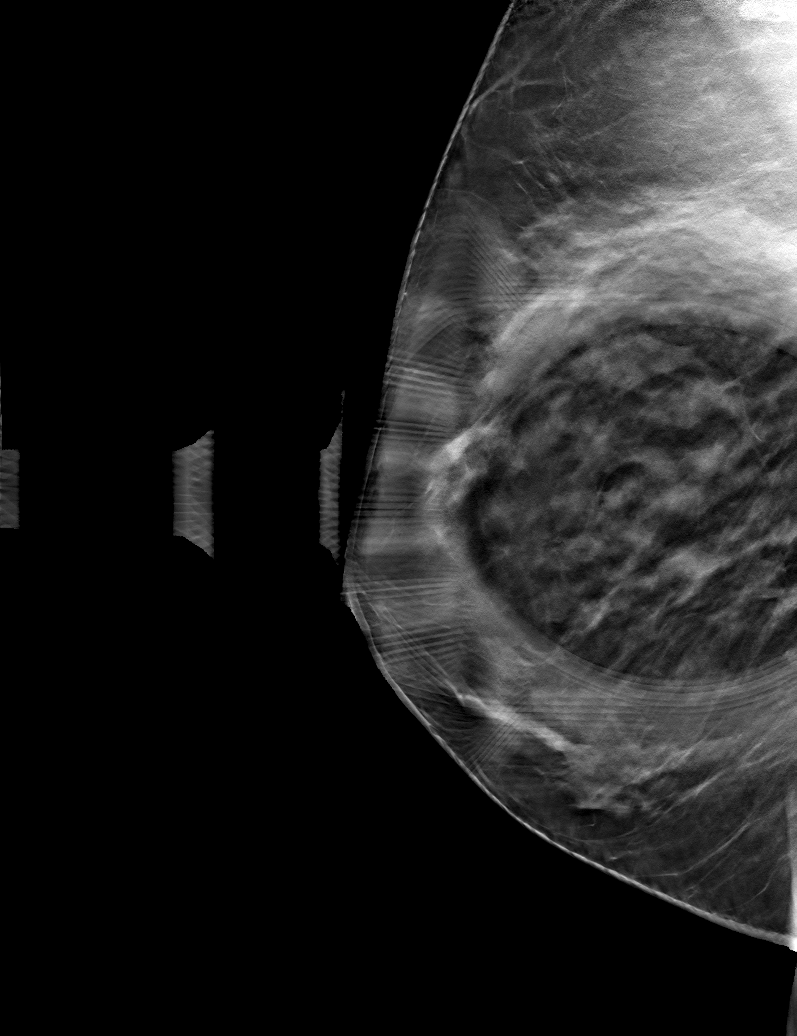

[6 of 18 positions shown; findings below may reference images not displayed]

ACR Breast Density Category c: The breast tissue is heterogeneously
dense, which may obscure small masses.
FINDINGS: Spot-compression CC and MLO views of the area of concern were
obtained.

The asymmetry in the outer breast at middle to posterior depth
questioned on screening mammography disperses with compression,
indicating overlapping fibroglandular tissue. There is no underlying
mass or architectural distortion.
IMPRESSION: No mammographic evidence of malignancy involving the RIGHT breast.

RECOMMENDATION:
Screening mammogram in one year.(Code:[place]-E-OPN)

I have discussed the findings and recommendations with the patient.
If applicable, a reminder letter will be sent to the patient
regarding the next appointment.

BI-RADS CATEGORY  1: Negative.

## 2023-07-04 ENCOUNTER — Other Ambulatory Visit: Payer: Self-pay | Admitting: Obstetrics and Gynecology

## 2023-07-04 DIAGNOSIS — R928 Other abnormal and inconclusive findings on diagnostic imaging of breast: Secondary | ICD-10-CM

## 2023-07-06 ENCOUNTER — Other Ambulatory Visit: Payer: Self-pay | Admitting: Medical Genetics

## 2023-07-13 ENCOUNTER — Other Ambulatory Visit (HOSPITAL_COMMUNITY)
Admission: RE | Admit: 2023-07-13 | Discharge: 2023-07-13 | Disposition: A | Discharge status: Home / Self Care | Payer: Self-pay | Source: Ambulatory Visit | Attending: Medical Genetics | Admitting: Medical Genetics

## 2023-07-13 ENCOUNTER — Ambulatory Visit
Admission: RE | Admit: 2023-07-13 | Discharge: 2023-07-13 | Disposition: A | Discharge status: Home / Self Care | Source: Ambulatory Visit | Attending: Obstetrics and Gynecology | Admitting: Obstetrics and Gynecology

## 2023-07-13 DIAGNOSIS — R928 Other abnormal and inconclusive findings on diagnostic imaging of breast: Secondary | ICD-10-CM

## 2023-07-25 LAB — GENECONNECT MOLECULAR SCREEN: Genetic Analysis Overall Interpretation: NEGATIVE
